# Patient Record
Sex: Male | Born: 1955 | Race: Black or African American | Hispanic: No | Marital: Single | Smoking: Never smoker
Health system: Southern US, Community
[De-identification: ages and names within clinical notes are randomized; demographics above are authoritative.]

## PROBLEM LIST (undated history)

## (undated) DIAGNOSIS — E785 Hyperlipidemia, unspecified: Secondary | ICD-10-CM

## (undated) DIAGNOSIS — I10 Essential (primary) hypertension: Secondary | ICD-10-CM

## (undated) DIAGNOSIS — E079 Disorder of thyroid, unspecified: Secondary | ICD-10-CM

## (undated) HISTORY — DX: Disorder of thyroid, unspecified: E07.9

## (undated) HISTORY — DX: Essential (primary) hypertension: I10

## (undated) HISTORY — DX: Hyperlipidemia, unspecified: E78.5

---

## 2014-03-21 DEATH — deceased

## 2020-03-20 ENCOUNTER — Encounter: Payer: Self-pay | Admitting: Emergency Medicine

## 2020-03-20 ENCOUNTER — Emergency Department
Admission: EM | Admit: 2020-03-20 | Discharge: 2020-03-20 | Disposition: A | Discharge status: Home / Self Care | Payer: 59 | Attending: Emergency Medicine | Admitting: Emergency Medicine

## 2020-03-20 ENCOUNTER — Emergency Department: Payer: 59

## 2020-03-20 ENCOUNTER — Other Ambulatory Visit: Payer: Self-pay

## 2020-03-20 DIAGNOSIS — Y999 Unspecified external cause status: Secondary | ICD-10-CM | POA: Diagnosis not present

## 2020-03-20 DIAGNOSIS — Y9389 Activity, other specified: Secondary | ICD-10-CM | POA: Diagnosis not present

## 2020-03-20 DIAGNOSIS — Z23 Encounter for immunization: Secondary | ICD-10-CM | POA: Diagnosis not present

## 2020-03-20 DIAGNOSIS — W293XXA Contact with powered garden and outdoor hand tools and machinery, initial encounter: Secondary | ICD-10-CM | POA: Diagnosis not present

## 2020-03-20 DIAGNOSIS — S81012A Laceration without foreign body, left knee, initial encounter: Secondary | ICD-10-CM | POA: Insufficient documentation

## 2020-03-20 DIAGNOSIS — Y9289 Other specified places as the place of occurrence of the external cause: Secondary | ICD-10-CM | POA: Insufficient documentation

## 2020-03-20 MED ORDER — LIDOCAINE HCL (PF) 1 % IJ SOLN
5.0000 mL | Freq: Once | INTRAMUSCULAR | Status: AC
  Administered 2020-03-20: 10 mL
  Filled 2020-03-20: qty 5

## 2020-03-20 MED ORDER — SULFAMETHOXAZOLE-TRIMETHOPRIM 800-160 MG PO TABS: 1.0000 | ORAL_TABLET | Freq: Two times a day (BID) | ORAL | 0 refills | Status: DC

## 2020-03-20 MED ORDER — TRAMADOL HCL 50 MG PO TABS: 50.0000 mg | ORAL_TABLET | Freq: Four times a day (QID) | ORAL | 0 refills | Status: DC | PRN

## 2020-03-20 MED ORDER — TETANUS-DIPHTH-ACELL PERTUSSIS 5-2.5-18.5 LF-MCG/0.5 IM SUSY
0.5000 mL | PREFILLED_SYRINGE | Freq: Once | INTRAMUSCULAR | Status: AC
  Administered 2020-03-20: 0.5 mL via INTRAMUSCULAR
  Filled 2020-03-20: qty 0.5

## 2020-03-20 MED ORDER — BACITRACIN-NEOMYCIN-POLYMYXIN 400-5-5000 EX OINT
TOPICAL_OINTMENT | Freq: Once | CUTANEOUS | Status: AC
  Administered 2020-03-20: 1 via TOPICAL
  Filled 2020-03-20: qty 1

## 2020-03-20 NOTE — ED Triage Notes (Addendum)
Pt in w/L knee laceration sustained while using chainsaw. Pt states he was cutting some overgrowth, and the teeth of the saw got caught, and grazed his knee. Has 1.5in lac present, and bleeding controlled. Full mobility to L leg

## 2020-03-20 NOTE — ED Provider Notes (Signed)
[facility] Emergency Department Provider Note   ____________________________________________   Event Date/Time   First MD Initiated Contact with Patient 03/20/20 1400     (approximate)  I have reviewed the triage vital signs and the nursing notes.   HISTORY  Chief Complaint Extremity Laceration    HPI Bryce [name] is a 65 y.o. male patient presents with left knee laceration.  Patient sustained injury while using a chainsaw.  Bleeding is controlled with direct pressure.  Patient denies loss sensation or use of the affected extremity.  Patient states tetanus status not up-to-date.  Rates pain as a 4/10.  Described pain as "sore".  No other palliative measure for complaint.         History reviewed. No pertinent past medical history.  There are no problems to display for this patient.   History reviewed. No pertinent surgical history.  Prior to Admission medications   Medication Sig Start Date End Date Taking? Authorizing Provider  levothyroxine (SYNTHROID) 50 MCG tablet Take 50 mcg by mouth daily before breakfast.   Yes [provider]  lisinopril (ZESTRIL) 40 MG tablet Take 40 mg by mouth daily.   Yes [provider]  simvastatin (ZOCOR) 80 MG tablet Take 80 mg by mouth daily.   Yes [provider]  sulfamethoxazole-trimethoprim (BACTRIM DS) 800-160 MG tablet Take 1 tablet by mouth 2 (two) times daily. 03/20/20  Yes Sable Feil, PA-C  traMADol (ULTRAM) 50 MG tablet Take 1 tablet (50 mg total) by mouth every 6 (six) hours as needed for moderate pain. 03/20/20  Yes Sable Feil, PA-C    Allergies Patient has no allergy information on record.  No family history on file.  Social History Social History   Tobacco Use  . Smoking status: Never Smoker  . Smokeless tobacco: Never Used  Substance Use Topics  . Alcohol use: Never  . Drug use: Never    Review of Systems Constitutional: No fever/chills Eyes: No  visual changes. ENT: No sore throat. Cardiovascular: Denies chest pain. Respiratory: Denies shortness of breath. Gastrointestinal: No abdominal pain.  No nausea, no vomiting.  No diarrhea.  No constipation. Genitourinary: Negative for dysuria. Musculoskeletal: Negative for back pain. Skin: Negative for rash.  Left anterior knee laceration Neurological: Negative for headaches, focal weakness or numbness.   ____________________________________________   PHYSICAL EXAM:  VITAL SIGNS: ED Triage Vitals  Enc Vitals Group     BP 03/20/20 1259 (!) 157/95     Pulse Rate 03/20/20 1259 83     Resp 03/20/20 1259 18     Temp 03/20/20 1259 98 F (36.7 C)     Temp Source 03/20/20 1259 Oral     SpO2 03/20/20 1259 98 %     Weight 03/20/20 1300 175 lb (79.4 kg)     Height --      Head Circumference --      Peak Flow --      Pain Score --      Pain Loc --      Pain Edu? --      Excl. in [place]? --    Constitutional: Alert and oriented. Well appearing and in no acute distress. Cardiovascular: Normal rate, regular rhythm. Grossly normal heart sounds.  Good peripheral circulation. Respiratory: Normal respiratory effort.  No retractions. Lungs CTAB. Gastrointestinal: Soft and nontender. No distention. No abdominal bruits. No CVA tenderness. Musculoskeletal: No lower extremity tenderness nor edema.  No joint effusions. Neurologic:  Normal speech and language.  No gross focal neurologic deficits are appreciated. No gait instability. Skin: 3 cm laceration inferior/ anterior left knee.   Psychiatric: Mood and affect are normal. Speech and behavior are normal.  ____________________________________________   LABS (all labs ordered are listed, but only abnormal results are displayed)  Labs Reviewed - No data to display ____________________________________________  EKG   ____________________________________________  RADIOLOGY I, Sable Feil, personally viewed and evaluated these images (plain  radiographs) as part of my medical decision making, as well as reviewing the written report by the radiologist.  ED MD interpretation:    Official radiology report(s): DG Knee 2 Views Left  Result Date: 03/20/2020 CLINICAL DATA:  The patient suffered a laceration to the anterior aspect of the left knee with a chain saw today. Initial encounter. EXAM: LEFT KNEE - 1-2 VIEW COMPARISON:  None. FINDINGS: There is no acute bony or joint abnormality. Bandaging is seen anterior to the patella. No radiopaque foreign body is identified. 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 Small exostosis along the medial metaphysis of the tibia measures 1.1 cm AP x 2.3 cm craniocaudal x 0.9 cm transverse. IMPRESSION: Laceration without underlying fracture or foreign body. Mild patellofemoral degenerative change. Small, benign exostosis medial metaphysis of the tibia. Electronically Signed   By: Inge Rise M.D.   On: 03/20/2020 14:05    ____________________________________________   PROCEDURES  Procedure(s) performed (including Critical Care):  Marland KitchenMarland KitchenLaceration Repair  Date/Time: 03/20/2020 2:40 PM Performed by: Sable Feil, PA-C Authorized by: Sable Feil, PA-C   Consent:    Consent obtained:  Verbal   Consent given by:  Patient   Risks discussed:  Infection, pain, poor cosmetic result, need for additional repair, nerve damage and poor wound healing Universal protocol:    Procedure explained and questions answered to patient or proxy's satisfaction: yes     Relevant documents present and verified: yes     Immediately prior to procedure, a time out was called: yes     Patient identity confirmed:  Verbally with patient Anesthesia:    Anesthesia method:  Local infiltration   Local anesthetic:  Lidocaine 1% w/o epi Laceration details:    Location:  Leg   Leg location:  L knee   Length (cm):  3   Depth (mm):   3 Pre-procedure details:    Preparation:  Patient was prepped and draped in usual sterile fashion Exploration:    Hemostasis achieved with:  Direct pressure   Contaminated: yes   Treatment:    Area cleansed with:  Povidone-iodine and saline   Amount of cleaning:  Extensive   Irrigation solution:  Sterile saline   Irrigation method:  Syringe   Visualized foreign bodies/material removed: yes     Debridement:  Minimal   Undermining:  None   Scar revision: no   Skin repair:    Repair method:  Sutures   Suture size:  3-0   Suture material:  Nylon   Suture technique:  Simple interrupted   Number of sutures:  10 Approximation:    Approximation:  Close Repair type:    Repair type:  Simple Post-procedure details:    Dressing:  Antibiotic ointment and sterile dressing   Procedure completion:  Tolerated well, no immediate complications     ____________________________________________   INITIAL IMPRESSION / ASSESSMENT AND PLAN / ED COURSE  As part of my medical decision making, I reviewed the following data within the  electronic MEDICAL RECORD NUMBER        Patient presents a laceration to the anterior left knee secondary to a chainsaw.  See procedure note for wound closure.  Patient was given a Tdap prior to departure.  Patient given discharge care instruction and advised take medication as directed.  Advised to have sutures removed in 10 days.      ____________________________________________   FINAL CLINICAL IMPRESSION(S) / ED DIAGNOSES  Final diagnoses:  Knee laceration, left, initial encounter     ED Discharge Orders         Ordered    sulfamethoxazole-trimethoprim (BACTRIM DS) 800-160 MG tablet  2 times daily        03/20/20 1448    traMADol (ULTRAM) 50 MG tablet  Every 6 hours PRN        03/20/20 1448          *Please note:  Bryce [name] was evaluated in Emergency Department on 03/20/2020 for the symptoms described in the history of present illness. He was  evaluated in the context of the global COVID-19 pandemic, which necessitated consideration that the patient might be at risk for infection with the SARS-CoV-2 virus that causes COVID-19. Institutional protocols and algorithms that pertain to the evaluation of patients at risk for COVID-19 are in a state of rapid change based on information released by regulatory bodies including the CDC and federal and state organizations. These policies and algorithms were followed during the patient's care in the ED.  Some ED evaluations and interventions may be delayed as a result of limited staffing during and the pandemic.*   Note:  This document was prepared using Dragon voice recognition software and may include unintentional dictation errors.    Sable Feil, PA-C 03/20/20 1500    Naaman Plummer, MD 03/21/20 [phone]

## 2020-03-20 NOTE — Discharge Instructions (Addendum)
Follow discharge care instruction and take medications as directed.  Have sutures removed in 10 days.

## 2020-03-20 NOTE — ED Notes (Signed)
Pt states he accidentally cut his left knee with a chain saw while cutting through a vine the chainsaw kicked back and cut him. Bleeding is controlled,  Clean saline gauze applied to wound

## 2020-03-30 ENCOUNTER — Emergency Department
Admission: EM | Admit: 2020-03-30 | Discharge: 2020-03-30 | Disposition: A | Discharge status: Home / Self Care | Payer: 59 | Attending: Emergency Medicine | Admitting: Emergency Medicine

## 2020-03-30 ENCOUNTER — Encounter: Payer: Self-pay | Admitting: Emergency Medicine

## 2020-03-30 ENCOUNTER — Other Ambulatory Visit: Payer: Self-pay

## 2020-03-30 DIAGNOSIS — Z4802 Encounter for removal of sutures: Secondary | ICD-10-CM

## 2020-03-30 DIAGNOSIS — X58XXXD Exposure to other specified factors, subsequent encounter: Secondary | ICD-10-CM | POA: Insufficient documentation

## 2020-03-30 DIAGNOSIS — S81012D Laceration without foreign body, left knee, subsequent encounter: Secondary | ICD-10-CM | POA: Insufficient documentation

## 2020-03-30 NOTE — ED Notes (Signed)
See triage note  Presents to have sutures removed  Denies any other sx's  Suture intact   No redness noted

## 2020-03-30 NOTE — ED Triage Notes (Signed)
Patient here to have sutures removed from left leg.

## 2020-03-30 NOTE — Discharge Instructions (Addendum)
Follow discharge care instruction return back to ED if condition worsens.

## 2020-03-30 NOTE — ED Provider Notes (Signed)
Bryce [name]   ____________________________________________   Event Date/Time   First MD Initiated Contact with Patient 03/30/20 1011     (approximate)  I have reviewed the triage vital signs and the nursing notes.   HISTORY  Chief Complaint Suture / Staple Removal    HPI Bryce [name] is a 65 y.o. male patient presents for suture removal.  Patient sustained a left knee laceration 10 days ago.  Patient voices no concerns or complaints.         History reviewed. No pertinent past medical history.  There are no problems to display for this patient.   History reviewed. No pertinent surgical history.  Prior to Admission medications   Medication Sig Start Date End Date Taking? Authorizing Provider  levothyroxine (SYNTHROID) 50 MCG tablet Take 50 mcg by mouth daily before breakfast.    [provider]  lisinopril (ZESTRIL) 40 MG tablet Take 40 mg by mouth daily.    [provider]  simvastatin (ZOCOR) 80 MG tablet Take 80 mg by mouth daily.    [provider]  sulfamethoxazole-trimethoprim (BACTRIM DS) 800-160 MG tablet Take 1 tablet by mouth 2 (two) times daily. 03/20/20   Sable Feil, PA-C  traMADol (ULTRAM) 50 MG tablet Take 1 tablet (50 mg total) by mouth every 6 (six) hours as needed for moderate pain. 03/20/20   Sable Feil, PA-C    Allergies Patient has no known allergies.  No family history on file.  Social History Social History   Tobacco Use  . Smoking status: Never Smoker  . Smokeless tobacco: Never Used  Substance Use Topics  . Alcohol use: Never  . Drug use: Never    Review of Systems Constitutional: No fever/chills Eyes: No visual changes. ENT: No sore throat. Cardiovascular: Denies chest pain. Respiratory: Denies shortness of breath. Gastrointestinal: No abdominal pain.  No nausea, no vomiting.  No diarrhea.  No constipation. Genitourinary: Negative for  dysuria. Musculoskeletal: Negative for back pain. Skin: Negative for rash.  Healing left knee laceration. Neurological: Negative for headaches, focal weakness or numbness.   ____________________________________________   PHYSICAL EXAM:  VITAL SIGNS: ED Triage Vitals  Enc Vitals Group     BP 03/30/20 0959 (!) 164/91     Pulse Rate 03/30/20 0959 67     Resp 03/30/20 0959 18     Temp 03/30/20 0959 (!) 97.5 F (36.4 C)     Temp Source 03/30/20 0959 Oral     SpO2 03/30/20 0959 100 %     Weight 03/30/20 1001 177 lb (80.3 kg)     Height 03/30/20 1001 5\' 9"  (1.753 m)     Head Circumference --      Peak Flow --      Pain Score 03/30/20 0957 0     Pain Loc --      Pain Edu? --      Excl. in [place]? --    Constitutional: Alert and oriented. Well appearing and in no acute distress. Cardiovascular: Normal rate, regular rhythm. Grossly normal heart sounds.  Good peripheral circulation.  Abated blood pressure. Respiratory: Normal respiratory effort.  No retractions. Lungs CTAB. Musculoskeletal: No lower extremity tenderness nor edema.  No joint effusions. Neurologic:  Normal speech and language. No gross focal neurologic deficits are appreciated. No gait instability. Skin:  Skin is warm, dry and intact. No rash noted. Psychiatric: Mood and affect are normal. Speech and behavior are normal.  ____________________________________________   LABS (  all labs ordered are listed, but only abnormal results are displayed)  Labs Reviewed - No data to display ____________________________________________  EKG   ____________________________________________  RADIOLOGY I, Sable Feil, personally viewed and evaluated these images (plain radiographs) as part of my medical decision making, as well as reviewing the written report by the radiologist.  ED MD interpretation:    Official radiology report(s): No results  found.  ____________________________________________   PROCEDURES  Procedure(s) performed (including Critical Care):  .Suture Removal  Date/Time: 03/30/2020 10:33 AM Performed by: Sable Feil, PA-C Authorized by: Sable Feil, PA-C   Consent:    Consent obtained:  Verbal   Consent given by:  Patient   Risks, benefits, and alternatives were discussed: yes     Risks discussed:  Bleeding, pain and wound separation Universal protocol:    Procedure explained and questions answered to patient or proxy's satisfaction: yes     Immediately prior to procedure, a time out was called: yes     Patient identity confirmed:  Verbally with patient Procedure details:    Wound appearance:  No signs of infection, good wound healing and clean   Number of sutures removed:  10 Post-procedure details:    Post-removal:  Steri-Strips applied and Band-Aid applied   Procedure completion:  Tolerated well, no immediate complications     ____________________________________________   INITIAL IMPRESSION / ASSESSMENT AND PLAN / ED COURSE  As part of my medical decision making, I reviewed the following data within the [place]         Patient presents with suture removal.  See procedure [name].  Follow-up as necessary.      ____________________________________________   FINAL CLINICAL IMPRESSION(S) / ED DIAGNOSES  Final diagnoses:  Encounter for removal of sutures     ED Discharge Orders    None      *Please [name]:  Bryce [name] was evaluated in Emergency Department on 03/30/2020 for the symptoms described in the history of present illness. He was evaluated in the context of the global COVID-19 pandemic, which necessitated consideration that the patient might be at risk for infection with the SARS-CoV-2 virus that causes COVID-19. Institutional protocols and algorithms that pertain to the evaluation of patients at risk for COVID-19 are in a state of rapid change  based on information released by regulatory bodies including the CDC and federal and state organizations. These policies and algorithms were followed during the patient's care in the ED.  Some ED evaluations and interventions may be delayed as a result of limited staffing during and the pandemic.*   [name]:  This document was prepared using Dragon voice recognition software and may include unintentional dictation errors.    Sable Feil, PA-C 03/30/20 1034    Lavonia Drafts, MD 03/30/20 1038

## 2021-02-07 ENCOUNTER — Encounter: Payer: Self-pay | Admitting: Internal Medicine

## 2021-02-07 ENCOUNTER — Other Ambulatory Visit: Payer: Self-pay

## 2021-02-07 ENCOUNTER — Ambulatory Visit: Payer: 59 | Admitting: Internal Medicine

## 2021-02-07 VITALS — BP 136/91 | HR 72 | Ht 69.0 in | Wt 177.2 lb

## 2021-02-07 DIAGNOSIS — E7800 Pure hypercholesterolemia, unspecified: Secondary | ICD-10-CM | POA: Diagnosis not present

## 2021-02-07 DIAGNOSIS — E039 Hypothyroidism, unspecified: Secondary | ICD-10-CM | POA: Diagnosis not present

## 2021-02-07 DIAGNOSIS — Z6826 Body mass index (BMI) 26.0-26.9, adult: Secondary | ICD-10-CM | POA: Insufficient documentation

## 2021-02-07 DIAGNOSIS — E663 Overweight: Secondary | ICD-10-CM | POA: Diagnosis not present

## 2021-02-07 DIAGNOSIS — I10 Essential (primary) hypertension: Secondary | ICD-10-CM | POA: Diagnosis not present

## 2021-02-07 DIAGNOSIS — E785 Hyperlipidemia, unspecified: Secondary | ICD-10-CM | POA: Insufficient documentation

## 2021-02-07 DIAGNOSIS — Z6825 Body mass index (BMI) 25.0-25.9, adult: Secondary | ICD-10-CM | POA: Insufficient documentation

## 2021-02-07 NOTE — Progress Notes (Signed)
HPI  Pt presents to the clinic today to establish care and for management of the conditions listed below.   Hypothyroidism: He denies any issues on his current dose of Levothyroxine. He does not follow with endocrinology.  HTN: His BP today is 139/61. He is taking Lisinopril as prescribed. There is no ECG on file.  HLD: There is no lipid panel on file. He denies myalgias on Simvastatin. He tries to consume a low fat diet.  No past medical history on file.  Current Outpatient Medications  Medication Sig Dispense Refill   levothyroxine (SYNTHROID) 50 MCG tablet Take 50 mcg by mouth daily before breakfast.     lisinopril (ZESTRIL) 40 MG tablet Take 40 mg by mouth daily.     simvastatin (ZOCOR) 80 MG tablet Take 80 mg by mouth daily.     sulfamethoxazole-trimethoprim (BACTRIM DS) 800-160 MG tablet Take 1 tablet by mouth 2 (two) times daily. 20 tablet 0   traMADol (ULTRAM) 50 MG tablet Take 1 tablet (50 mg total) by mouth every 6 (six) hours as needed for moderate pain. 12 tablet 0   No current facility-administered medications for this visit.    No Known Allergies  No family history on file.  Social History   Socioeconomic History   Marital status: Single    Spouse name: Not on file   Number of children: Not on file   Years of education: Not on file   Highest education level: Not on file  Occupational History   Not on file  Tobacco Use   Smoking status: Never   Smokeless tobacco: Never  Substance and Sexual Activity   Alcohol use: Never   Drug use: Never   Sexual activity: Not on file  Other Topics Concern   Not on file  Social History Narrative   Not on file   Social Determinants of Health   Financial Resource Strain: Not on file  Food Insecurity: Not on file  Transportation Needs: Not on file  Physical Activity: Not on file  Stress: Not on file  Social Connections: Not on file  Intimate Partner Violence: Not on file    ROS:  Constitutional: Denies fever,  malaise, fatigue, headache or abrupt weight changes.  HEENT: Denies eye pain, eye redness, ear pain, ringing in the ears, wax buildup, runny nose, nasal congestion, bloody nose, or sore throat. Respiratory: Denies difficulty breathing, shortness of breath, cough or sputum production.   Cardiovascular: Denies chest pain, chest tightness, palpitations or swelling in the hands or feet.  Gastrointestinal: Denies abdominal pain, bloating, constipation, diarrhea or blood in the stool.  GU: Denies frequency, urgency, pain with urination, blood in urine, odor or discharge. Musculoskeletal: Denies decrease in range of motion, difficulty with gait, muscle pain or joint pain and swelling.  Skin: Denies redness, rashes, lesions or ulcercations.  Neurological: Denies dizziness, difficulty with memory, difficulty with speech or problems with balance and coordination.  Psych: Denies anxiety, depression, SI/HI.  No other specific complaints in a complete review of systems (except as listed in HPI above).  PE: BP (!) 136/91    Pulse 72    Ht 5\' 9"  (1.753 m)    Wt 177 lb 3.2 oz (80.4 kg)    SpO2 100%    BMI 26.17 kg/m   Wt Readings from Last 3 Encounters:  03/30/20 177 lb (80.3 kg)  03/20/20 175 lb (79.4 kg)    General: Appears his stated age, overweight, in NAD. Skin: Dry and intact HEENT: Head: normal  shape and size; Eyes: EOMs intact;  Cardiovascular: Normal rate and rhythm. S1,S2 noted.  No murmur, rubs or gallops noted. No JVD or BLE edema. No carotid bruits noted. Pulmonary/Chest: Normal effort and positive vesicular breath sounds. No respiratory distress. No wheezes, rales or ronchi noted.  Musculoskeletal: No difficulty with gait.  Neurological: Alert and oriented.  Psychiatric: Mood and affect normal. Behavior is normal. Judgment and thought content normal.    Assessment and Plan:   Webb Silversmith, NP This visit occurred during the SARS-CoV-2 public health emergency.  Safety protocols were  in place, including screening questions prior to the visit, additional usage of staff PPE, and extensive cleaning of exam room while observing appropriate contact time as indicated for disinfecting solutions.

## 2021-02-07 NOTE — Assessment & Plan Note (Signed)
Reasonable control on Lisinopril, managed by [facility] diet and exercise for weight loss

## 2021-02-07 NOTE — Assessment & Plan Note (Signed)
Encourage diet and exercise for weight loss 

## 2021-02-07 NOTE — Assessment & Plan Note (Signed)
Continue Levothyroxine, managed by the [facility]

## 2021-02-07 NOTE — Assessment & Plan Note (Signed)
Continue Simvastatin, managed by the [facility]

## 2021-02-07 NOTE — Patient Instructions (Signed)
Heart-Healthy Eating Plan Many factors influence your heart (coronary) health, including eating and exercise habits. Coronary risk increases with abnormal blood fat (lipid) levels. Heart-healthy meal planning includes limiting unhealthy fats, increasing healthy fats, and making other diet and lifestyle changes. What is my plan? Your health care provider may recommend that you: Limit your fat intake to _________% or less of your total calories each day. Limit your saturated fat intake to _________% or less of your total calories each day. Limit the amount of cholesterol in your diet to less than _________ mg per day. What are tips for following this plan? Cooking Cook foods using methods other than frying. Baking, boiling, grilling, and broiling are all good options. Other ways to reduce fat include: Removing the skin from poultry. Removing all visible fats from meats. Steaming vegetables in water or broth. Meal planning  At meals, imagine dividing your plate into fourths: Fill one-half of your plate with vegetables and green salads. Fill one-fourth of your plate with whole grains. Fill one-fourth of your plate with lean protein foods. Eat 4-5 servings of vegetables per day. One serving equals 1 cup raw or cooked vegetable, or 2 cups raw leafy greens. Eat 4-5 servings of fruit per day. One serving equals 1 medium whole fruit,  cup dried fruit,  cup fresh, frozen, or canned fruit, or  cup 100% fruit juice. Eat more foods that contain soluble fiber. Examples include apples, broccoli, carrots, beans, peas, and barley. Aim to get 25-30 g of fiber per day. Increase your consumption of legumes, nuts, and seeds to 4-5 servings per week. One serving of dried beans or legumes equals  cup cooked, 1 serving of nuts is  cup, and 1 serving of seeds equals 1 tablespoon. Fats Choose healthy fats more often. Choose monounsaturated and polyunsaturated fats, such as olive and canola oils, flaxseeds,  walnuts, almonds, and seeds. Eat more omega-3 fats. Choose salmon, mackerel, sardines, tuna, flaxseed oil, and ground flaxseeds. Aim to eat fish at least 2 times each week. Check food labels carefully to identify foods with trans fats or high amounts of saturated fat. Limit saturated fats. These are found in animal products, such as meats, butter, and cream. Plant sources of saturated fats include palm oil, palm kernel oil, and coconut oil. Avoid foods with partially hydrogenated oils in them. These contain trans fats. Examples are stick margarine, some tub margarines, cookies, crackers, and other baked goods. Avoid fried foods. General information Eat more home-cooked food and less restaurant, buffet, and fast food. Limit or avoid alcohol. Limit foods that are high in starch and sugar. Lose weight if you are overweight. Losing just 5-10% of your body weight can help your overall health and prevent diseases such as diabetes and heart disease. Monitor your salt (sodium) intake, especially if you have high blood pressure. Talk with your health care provider about your sodium intake. Try to incorporate more vegetarian meals weekly. What foods can I eat? Fruits All fresh, canned (in natural juice), or frozen fruits. Vegetables Fresh or frozen vegetables (raw, steamed, roasted, or grilled). Green salads. Grains Most grains. Choose whole wheat and whole grains most of the time. Rice and pasta, including brown rice and pastas made with whole wheat. Meats and other proteins Lean, well-trimmed beef, veal, pork, and lamb. Chicken and turkey without skin. All fish and shellfish. Wild duck, rabbit, pheasant, and venison. Egg whites or low-cholesterol egg substitutes. Dried beans, peas, lentils, and tofu. Seeds and most nuts. Dairy Low-fat or nonfat cheeses,   including ricotta and mozzarella. Skim or 1% milk (liquid, powdered, or evaporated). Buttermilk made with low-fat milk. Nonfat or low-fat  yogurt. Fats and oils Non-hydrogenated (trans-free) margarines. Vegetable oils, including soybean, sesame, sunflower, olive, peanut, safflower, corn, canola, and cottonseed. Salad dressings or mayonnaise made with a vegetable oil. Beverages Water (mineral or sparkling). Coffee and tea. Diet carbonated beverages. Sweets and desserts Sherbet, gelatin, and fruit ice. Small amounts of dark chocolate. Limit all sweets and desserts. Seasonings and condiments All seasonings and condiments. The items listed above may not be a complete list of foods and beverages you can eat. Contact a dietitian for more options. What foods are not recommended? Fruits Canned fruit in heavy syrup. Fruit in cream or butter sauce. Fried fruit. Limit coconut. Vegetables Vegetables cooked in cheese, cream, or butter sauce. Fried vegetables. Grains Breads made with saturated or trans fats, oils, or whole milk. Croissants. Sweet rolls. Donuts. High-fat crackers, such as cheese crackers. Meats and other proteins Fatty meats, such as hot dogs, ribs, sausage, bacon, rib-eye roast or steak. High-fat deli meats, such as salami and bologna. Caviar. Domestic duck and goose. Organ meats, such as liver. Dairy Cream, sour cream, cream cheese, and creamed cottage cheese. Whole-milk cheeses. Whole or 2% milk (liquid, evaporated, or condensed). Whole buttermilk. Cream sauce or high-fat cheese sauce. Whole-milk yogurt. Fats and oils Meat fat, or shortening. Cocoa butter, hydrogenated oils, palm oil, coconut oil, palm kernel oil. Solid fats and shortenings, including bacon fat, salt pork, lard, and butter. Nondairy cream substitutes. Salad dressings with cheese or sour cream. Beverages Regular sodas and any drinks with added sugar. Sweets and desserts Frosting. Pudding. Cookies. Cakes. Pies. Milk chocolate or white chocolate. Buttered syrups. Full-fat ice cream or ice cream drinks. The items listed above may not be a complete list of  foods and beverages to avoid. Contact a dietitian for more information. Summary Heart-healthy meal planning includes limiting unhealthy fats, increasing healthy fats, and making other diet and lifestyle changes. Lose weight if you are overweight. Losing just 5-10% of your body weight can help your overall health and prevent diseases such as diabetes and heart disease. Focus on eating a balance of foods, including fruits and vegetables, low-fat or nonfat dairy, lean protein, nuts and legumes, whole grains, and heart-healthy oils and fats. This information is not intended to replace advice given to you by your health care provider. Make sure you discuss any questions you have with your health care provider. Document Revised: 05/17/2020 Document Reviewed: 05/17/2020 Elsevier Patient Education  2022 Elsevier Inc.  

## 2021-04-25 DIAGNOSIS — H43813 Vitreous degeneration, bilateral: Secondary | ICD-10-CM | POA: Diagnosis not present

## 2021-12-17 ENCOUNTER — Other Ambulatory Visit: Payer: Self-pay

## 2021-12-17 ENCOUNTER — Telehealth: Payer: Self-pay

## 2021-12-17 ENCOUNTER — Ambulatory Visit (INDEPENDENT_AMBULATORY_CARE_PROVIDER_SITE_OTHER): Payer: Medicare Other | Admitting: Internal Medicine

## 2021-12-17 ENCOUNTER — Encounter: Payer: Self-pay | Admitting: Internal Medicine

## 2021-12-17 VITALS — BP 147/84 | HR 65 | Temp 97.1°F | Ht 70.0 in | Wt 168.0 lb

## 2021-12-17 DIAGNOSIS — R7309 Other abnormal glucose: Secondary | ICD-10-CM | POA: Diagnosis not present

## 2021-12-17 DIAGNOSIS — Z86010 Personal history of colonic polyps: Secondary | ICD-10-CM

## 2021-12-17 DIAGNOSIS — R768 Other specified abnormal immunological findings in serum: Secondary | ICD-10-CM | POA: Diagnosis not present

## 2021-12-17 DIAGNOSIS — Z0001 Encounter for general adult medical examination with abnormal findings: Secondary | ICD-10-CM | POA: Diagnosis not present

## 2021-12-17 DIAGNOSIS — E039 Hypothyroidism, unspecified: Secondary | ICD-10-CM | POA: Diagnosis not present

## 2021-12-17 DIAGNOSIS — Z1211 Encounter for screening for malignant neoplasm of colon: Secondary | ICD-10-CM

## 2021-12-17 DIAGNOSIS — E7800 Pure hypercholesterolemia, unspecified: Secondary | ICD-10-CM

## 2021-12-17 DIAGNOSIS — Z23 Encounter for immunization: Secondary | ICD-10-CM | POA: Diagnosis not present

## 2021-12-17 DIAGNOSIS — Z1159 Encounter for screening for other viral diseases: Secondary | ICD-10-CM

## 2021-12-17 DIAGNOSIS — I10 Essential (primary) hypertension: Secondary | ICD-10-CM

## 2021-12-17 MED ORDER — NA SULFATE-K SULFATE-MG SULF 17.5-3.13-1.6 GM/177ML PO SOLN: 1.0000 | Freq: Once | ORAL | 0 refills | Status: AC

## 2021-12-17 NOTE — Assessment & Plan Note (Signed)
Uncontrolled off lisinopril, he will restart this today Reinforced DASH diet

## 2021-12-17 NOTE — Progress Notes (Signed)
 Subjective:    Patient ID: Bryce [name], male    DOB: 11/17/1955, 66 y.o.   MRN: [id number]  HPI  Patient presents to clinic today for his annual exam.  Of note, his BP today is 152/82.  He reports he has not taken his Lisinopril in 2 weeks because he has not been feeling well  Flu: never Tetanus: 03/2020 COVID: x 4 Pneumovax: never Prevnar: never Shingrix: x 2 at [facility] Colon screening:  > 10 years ago Vision screening: annually Dentist: biannually  Diet: He does eat lean meat. He consumes fruits and veggies. He tries to avoid fried foods. He drinks mostly water, hot tea. Exercise: body weight exercise, treadmill.  Review of Systems     Past Medical History:  Diagnosis Date   Hyperlipidemia    Hypertension    Thyroid disease     Current Outpatient Medications  Medication Sig Dispense Refill   Cholecalciferol 25 MCG (1000 UT) tablet TAKE TWO TABLETS BY MOUTH ONCE EVERY DAY     levothyroxine (SYNTHROID) 50 MCG tablet Take 50 mcg by mouth daily before breakfast.     lisinopril (ZESTRIL) 40 MG tablet Take 40 mg by mouth daily.     simvastatin (ZOCOR) 80 MG tablet Take 80 mg by mouth daily.     No current facility-administered medications for this visit.    No Known Allergies  No family history on file.  Social History   Socioeconomic History   Marital status: Single    Spouse name: Not on file   Number of children: Not on file   Years of education: Not on file   Highest education level: Not on file  Occupational History   Not on file  Tobacco Use   Smoking status: Never   Smokeless tobacco: Never  Substance and Sexual Activity   Alcohol use: Never   Drug use: Never   Sexual activity: Not on file  Other Topics Concern   Not on file  Social History Narrative   Not on file   Social Determinants of Health   Financial Resource Strain: Not on file  Food Insecurity: Not on file  Transportation Needs: Not on file  Physical  Activity: Not on file  Stress: Not on file  Social Connections: Not on file  Intimate Partner Violence: Not on file     Constitutional: Denies fever, malaise, fatigue, headache or abrupt weight changes.  HEENT: Denies eye pain, eye redness, ear pain, ringing in the ears, wax buildup, runny nose, nasal congestion, bloody nose, or sore throat. Respiratory: Denies difficulty breathing, shortness of breath, cough or sputum production.   Cardiovascular: Denies chest pain, chest tightness, palpitations or swelling in the hands or feet.  Gastrointestinal: Denies abdominal pain, bloating, constipation, diarrhea or blood in the stool.  GU: Denies urgency, frequency, pain with urination, burning sensation, blood in urine, odor or discharge. Musculoskeletal: Denies decrease in range of motion, difficulty with gait, muscle pain or joint pain and swelling.  Skin: Denies redness, rashes, lesions or ulcercations.  Neurological: Denies dizziness, difficulty with memory, difficulty with speech or problems with balance and coordination.  Psych: Denies anxiety, depression, SI/HI.  No other specific complaints in a complete review of systems (except as listed in HPI above).  Objective:   Physical Exam   BP (!) 147/84 (BP Location: Right Arm, Patient Position: Sitting, Cuff Size: Normal)   Pulse 65   Temp (!) 97.1 F (36.2 C) (Temporal)   Ht 5' 10" (  1.778 m)   Wt 168 lb (76.2 kg)   SpO2 100%   BMI 24.11 kg/m   Wt Readings from Last 3 Encounters:  02/07/21 177 lb 3.2 oz (80.4 kg)  03/30/20 177 lb (80.3 kg)  03/20/20 175 lb (79.4 kg)    General: Appears his stated age, well developed, well nourished in NAD. Skin: Warm, dry and intact.  HEENT: Head: normal shape and size; Eyes: sclera white, no icterus, conjunctiva pink, PERRLA and EOMs intact;  Neck:  Neck supple, trachea midline. No masses, lumps or thyromegaly present.  Cardiovascular: Normal rate and rhythm. S1,S2 noted.  No murmur, rubs or  gallops noted. No JVD or BLE edema. No carotid bruits noted.  Pulses 2+ BLE. Pulmonary/Chest: Normal effort and positive vesicular breath sounds. No respiratory distress. No wheezes, rales or ronchi noted.  Abdomen: Soft and nontender. Normal bowel sounds.  Musculoskeletal: Strength 5/5 BUE/BLE. No difficulty with gait.  Neurological: Alert and oriented. Cranial nerves II-XII grossly intact. Coordination normal.  Psychiatric: Mood and affect normal. Behavior is normal. Judgment and thought content normal.          Assessment & Plan:   Preventative Health Maintenance:  He declines flu shots Tetanus UTD  Encouraged him to get his covid booster Prevnar 20 today, he will not need Pneumovax Discussed Shingrix vaccine, he will check coverage with his insurance company and schedule a visit at the pharmacy if he would like to have this done Referral to GI for screening colonoscopy Encouraged him to consume a balanced diet and exercise regimen Advised him to see an eye doctor and dentist annually We will check CBC, c-Met, TSH, free T4, lipid, A1c and hep C today  RTC in 2 weeks for follow-up HTN, 6 months, follow-up chronic conditions Regina Baity, NP  

## 2021-12-17 NOTE — Patient Instructions (Signed)
Health Maintenance After Age 65 After age 65, you are at a higher risk for certain long-term diseases and infections as well as injuries from falls. Falls are a major cause of broken bones and head injuries in people who are older than age 65. Getting regular preventive care can help to keep you healthy and well. Preventive care includes getting regular testing and making lifestyle changes as recommended by your health care provider. Talk with your health care provider about: Which screenings and tests you should have. A screening is a test that checks for a disease when you have no symptoms. A diet and exercise plan that is right for you. What should I know about screenings and tests to prevent falls? Screening and testing are the best ways to find a health problem early. Early diagnosis and treatment give you the best chance of managing medical conditions that are common after age 65. Certain conditions and lifestyle choices may make you more likely to have a fall. Your health care provider may recommend: Regular vision checks. Poor vision and conditions such as cataracts can make you more likely to have a fall. If you wear glasses, make sure to get your prescription updated if your vision changes. Medicine review. Work with your health care provider to regularly review all of the medicines you are taking, including over-the-counter medicines. Ask your health care provider about any side effects that may make you more likely to have a fall. Tell your health care provider if any medicines that you take make you feel dizzy or sleepy. Strength and balance checks. Your health care provider may recommend certain tests to check your strength and balance while standing, walking, or changing positions. Foot health exam. Foot pain and numbness, as well as not wearing proper footwear, can make you more likely to have a fall. Screenings, including: Osteoporosis screening. Osteoporosis is a condition that causes  the bones to get weaker and break more easily. Blood pressure screening. Blood pressure changes and medicines to control blood pressure can make you feel dizzy. Depression screening. You may be more likely to have a fall if you have a fear of falling, feel depressed, or feel unable to do activities that you used to do. Alcohol use screening. Using too much alcohol can affect your balance and may make you more likely to have a fall. Follow these instructions at home: Lifestyle Do not drink alcohol if: Your health care provider tells you not to drink. If you drink alcohol: Limit how much you have to: 0-1 drink a day for women. 0-2 drinks a day for men. Know how much alcohol is in your drink. In the [place] one drink equals one 12 oz bottle of beer (355 mL), one 5 oz glass of wine (148 mL), or one 1 oz glass of hard liquor (44 mL). Do not use any products that contain nicotine or tobacco. These products include cigarettes, chewing tobacco, and vaping devices, such as e-cigarettes. If you need help quitting, ask your health care provider. Activity  Follow a regular exercise program to stay fit. This will help you maintain your balance. Ask your health care provider what types of exercise are appropriate for you. If you need a cane or walker, use it as recommended by your health care provider. Wear supportive shoes that have nonskid soles. Safety  Remove any tripping hazards, such as rugs, cords, and clutter. Install safety equipment such as grab bars in bathrooms and safety rails on stairs. Keep rooms and walkways   well-lit. General instructions Talk with your health care provider about your risks for falling. Tell your health care provider if: You fall. Be sure to tell your health care provider about all falls, even ones that seem minor. You feel dizzy, tiredness (fatigue), or off-balance. Take over-the-counter and prescription medicines only as told by your health care provider. These include  supplements. Eat a healthy diet and maintain a healthy weight. A healthy diet includes low-fat dairy products, low-fat (lean) meats, and fiber from whole grains, beans, and lots of fruits and vegetables. Stay current with your vaccines. Schedule regular health, dental, and eye exams. Summary Having a healthy lifestyle and getting preventive care can help to protect your health and wellness after age 65. Screening and testing are the best way to find a health problem early and help you avoid having a fall. Early diagnosis and treatment give you the best chance for managing medical conditions that are more common for people who are older than age 65. Falls are a major cause of broken bones and head injuries in people who are older than age 65. Take precautions to prevent a fall at home. Work with your health care provider to learn what changes you can make to improve your health and wellness and to prevent falls. This information is not intended to replace advice given to you by your health care provider. Make sure you discuss any questions you have with your health care provider. Document Revised: 05/28/2020 Document Reviewed: 05/28/2020 Elsevier Patient Education  2023 Elsevier Inc.  

## 2021-12-17 NOTE — Telephone Encounter (Signed)
Gastroenterology Pre-Procedure Review  Request Date: 12/30/21 Requesting Physician: Dr. Vicente Males  PATIENT REVIEW QUESTIONS: The patient responded to the following health history questions as indicated:    1. Are you having any GI issues? no 2. Do you have a personal history of Polyps? yes (over 10 years ago at the [facility]) 3. Do you have a family history of Colon Cancer or Polyps? no 4. Diabetes Mellitus? no 5. Joint replacements in the past 12 months?no 6. Major health problems in the past 3 months?no 7. Any artificial heart valves, MVP, or defibrillator?no    MEDICATIONS & ALLERGIES:    Patient reports the following regarding taking any anticoagulation/antiplatelet therapy:   Plavix, Coumadin, Eliquis, Xarelto, Lovenox, Pradaxa, Brilinta, or Effient? no Aspirin? no  Patient confirms/reports the following medications:  Current Outpatient Medications  Medication Sig Dispense Refill   Cholecalciferol 25 MCG (1000 UT) tablet TAKE TWO TABLETS BY MOUTH ONCE EVERY DAY     levothyroxine (SYNTHROID) 50 MCG tablet Take 50 mcg by mouth daily before breakfast.     lisinopril (ZESTRIL) 40 MG tablet Take 40 mg by mouth daily.     simvastatin (ZOCOR) 80 MG tablet Take 80 mg by mouth daily.     No current facility-administered medications for this visit.    Patient confirms/reports the following allergies:  No Known Allergies  No orders of the defined types were placed in this encounter.   AUTHORIZATION INFORMATION Primary Insurance: 1D#: Group #:  Secondary Insurance: 1D#: Group #:  SCHEDULE INFORMATION: Date: 12/30/21 Time: Location: [facility]

## 2021-12-19 ENCOUNTER — Other Ambulatory Visit: Payer: Self-pay

## 2021-12-19 DIAGNOSIS — R768 Other specified abnormal immunological findings in serum: Secondary | ICD-10-CM

## 2021-12-19 NOTE — Addendum Note (Signed)
Addended by: Jearld Fenton on: 12/19/2021 11:39 AM   Modules accepted: Orders

## 2021-12-20 ENCOUNTER — Other Ambulatory Visit: Payer: 59

## 2021-12-20 DIAGNOSIS — R768 Other specified abnormal immunological findings in serum: Secondary | ICD-10-CM | POA: Diagnosis not present

## 2021-12-20 MED ORDER — ATORVASTATIN CALCIUM 20 MG PO TABS: 20.0000 mg | ORAL_TABLET | Freq: Every day | ORAL | 1 refills | Status: DC

## 2021-12-20 NOTE — Addendum Note (Signed)
Addended by: Jearld Fenton on: 12/20/2021 11:34 AM   Modules accepted: Orders

## 2021-12-20 NOTE — Addendum Note (Signed)
Addended by: Jearld Fenton on: 12/20/2021 11:33 AM   Modules accepted: Orders

## 2021-12-21 LAB — COMPLETE METABOLIC PANEL WITH GFR
AG Ratio: 1.6 (calc) (ref 1.0–2.5)
ALT: 15 U/L (ref 9–46)
AST: 19 U/L (ref 10–35)
Albumin: 4.4 g/dL (ref 3.6–5.1)
Alkaline phosphatase (APISO): 45 U/L (ref 35–144)
BUN: 17 mg/dL (ref 7–25)
CO2: 26 mmol/L (ref 20–32)
Calcium: 10.4 mg/dL — ABNORMAL HIGH (ref 8.6–10.3)
Chloride: 108 mmol/L (ref 98–110)
Creat: 1.27 mg/dL (ref 0.70–1.35)
Globulin: 2.7 g/dL (calc) (ref 1.9–3.7)
Glucose, Bld: 98 mg/dL (ref 65–99)
Potassium: 3.9 mmol/L (ref 3.5–5.3)
Sodium: 142 mmol/L (ref 135–146)
Total Bilirubin: 1 mg/dL (ref 0.2–1.2)
Total Protein: 7.1 g/dL (ref 6.1–8.1)
eGFR: 62 mL/min/{1.73_m2} (ref >=60)

## 2021-12-21 LAB — CBC
HCT: 44.2 % (ref 38.5–50.0)
Hemoglobin: 14.9 g/dL (ref 13.2–17.1)
MCH: 28.8 pg (ref 27.0–33.0)
MCHC: 33.7 g/dL (ref 32.0–36.0)
MCV: 85.3 fL (ref 80.0–100.0)
MPV: 10.5 fL (ref 7.5–12.5)
Platelets: 156 10*3/uL (ref 140–400)
RBC: 5.18 10*6/uL (ref 4.20–5.80)
RDW: 13.6 % (ref 11.0–15.0)
WBC: 4.6 10*3/uL (ref 3.8–10.8)

## 2021-12-21 LAB — HEMOGLOBIN A1C
Hgb A1c MFr Bld: 5.6 % of total Hgb (ref <5.7)
Mean Plasma Glucose: 114 mg/dL
eAG (mmol/L): 6.3 mmol/L

## 2021-12-21 LAB — HCV RNA,QUANTITATIVE REAL TIME PCR
HCV Quantitative Log: <1.18 Log IU/mL
HCV RNA, PCR, QN: <15 IU/mL

## 2021-12-21 LAB — LIPID PANEL
Cholesterol: 260 mg/dL — ABNORMAL HIGH (ref <200)
HDL: 66 mg/dL (ref >=40)
LDL Cholesterol (Calc): 179 mg/dL (calc) — ABNORMAL HIGH
Non-HDL Cholesterol (Calc): 194 mg/dL (calc) — ABNORMAL HIGH (ref <130)
Total CHOL/HDL Ratio: 3.9 (calc) (ref <5.0)
Triglycerides: 48 mg/dL (ref <150)

## 2021-12-21 LAB — TSH+FREE T4: TSH W/REFLEX TO FT4: 1.08 mIU/L (ref 0.40–4.50)

## 2021-12-21 LAB — HEPATITIS C ANTIBODY: Hepatitis C Ab: REACTIVE — AB

## 2021-12-25 LAB — HEPATITIS C RNA QUANTITATIVE
HCV Quantitative Log: <1.18 log IU/mL
HCV RNA, PCR, QN: <15 IU/mL

## 2021-12-25 LAB — HEPATITIS C GENOTYPE: HCV Genotype: NOT DETECTED

## 2021-12-27 ENCOUNTER — Encounter: Payer: Self-pay | Admitting: Gastroenterology

## 2021-12-30 ENCOUNTER — Ambulatory Visit
Admission: RE | Admit: 2021-12-30 | Discharge: 2021-12-30 | Disposition: A | Discharge status: Home / Self Care | Payer: Medicare Other | Source: Ambulatory Visit | Attending: Gastroenterology | Admitting: Gastroenterology

## 2021-12-30 ENCOUNTER — Ambulatory Visit: Payer: Medicare Other | Admitting: Anesthesiology

## 2021-12-30 ENCOUNTER — Encounter: Payer: Self-pay | Admitting: Gastroenterology

## 2021-12-30 ENCOUNTER — Encounter
Admission: RE | Disposition: A | Discharge status: Home / Self Care | Payer: Self-pay | Source: Ambulatory Visit | Attending: Gastroenterology

## 2021-12-30 DIAGNOSIS — E039 Hypothyroidism, unspecified: Secondary | ICD-10-CM | POA: Diagnosis not present

## 2021-12-30 DIAGNOSIS — Z86010 Personal history of colonic polyps: Secondary | ICD-10-CM

## 2021-12-30 DIAGNOSIS — D126 Benign neoplasm of colon, unspecified: Secondary | ICD-10-CM | POA: Diagnosis not present

## 2021-12-30 DIAGNOSIS — K640 First degree hemorrhoids: Secondary | ICD-10-CM | POA: Diagnosis not present

## 2021-12-30 DIAGNOSIS — K635 Polyp of colon: Secondary | ICD-10-CM | POA: Diagnosis not present

## 2021-12-30 DIAGNOSIS — I10 Essential (primary) hypertension: Secondary | ICD-10-CM | POA: Insufficient documentation

## 2021-12-30 DIAGNOSIS — E079 Disorder of thyroid, unspecified: Secondary | ICD-10-CM | POA: Insufficient documentation

## 2021-12-30 DIAGNOSIS — E785 Hyperlipidemia, unspecified: Secondary | ICD-10-CM | POA: Insufficient documentation

## 2021-12-30 DIAGNOSIS — Z1211 Encounter for screening for malignant neoplasm of colon: Secondary | ICD-10-CM | POA: Diagnosis not present

## 2021-12-30 HISTORY — PX: COLONOSCOPY WITH PROPOFOL: SHX5780

## 2021-12-30 SURGERY — COLONOSCOPY WITH PROPOFOL
Anesthesia: General

## 2021-12-30 MED ORDER — PHENYLEPHRINE 80 MCG/ML (10ML) SYRINGE FOR IV PUSH (FOR BLOOD PRESSURE SUPPORT)
PREFILLED_SYRINGE | INTRAVENOUS | Status: AC
  Filled 2021-12-30: qty 10

## 2021-12-30 MED ORDER — SODIUM CHLORIDE 0.9 % IV SOLN: INTRAVENOUS | Status: DC

## 2021-12-30 MED ORDER — PROPOFOL 10 MG/ML IV BOLUS
INTRAVENOUS | Status: DC | PRN
  Administered 2021-12-30: 70 mg via INTRAVENOUS

## 2021-12-30 MED ORDER — PROPOFOL 10 MG/ML IV BOLUS: INTRAVENOUS | Status: DC | PRN

## 2021-12-30 MED ORDER — PROPOFOL 1000 MG/100ML IV EMUL
INTRAVENOUS | Status: AC
  Filled 2021-12-30: qty 100

## 2021-12-30 MED ORDER — SIMETHICONE 40 MG/0.6ML PO SUSP
ORAL | Status: DC | PRN
  Administered 2021-12-30: 120 mL

## 2021-12-30 MED ORDER — PROPOFOL 500 MG/50ML IV EMUL
INTRAVENOUS | Status: DC | PRN
  Administered 2021-12-30: 140 ug/kg/min via INTRAVENOUS

## 2021-12-30 NOTE — Anesthesia Preprocedure Evaluation (Signed)
Anesthesia Evaluation  Patient identified by MRN, date of birth, ID band Patient awake    Reviewed: Allergy & Precautions, NPO status , Patient's Chart, lab work & pertinent test results  Airway Mallampati: III  TM Distance: >3 FB Neck ROM: full    Dental  (+) Chipped   Pulmonary neg pulmonary ROS, neg shortness of breath   Pulmonary exam normal        Cardiovascular Exercise Tolerance: Good hypertension, Normal cardiovascular exam     Neuro/Psych negative neurological ROS  negative psych ROS   GI/Hepatic negative GI ROS, Neg liver ROS,neg GERD  ,,  Endo/Other  Hypothyroidism    Renal/GU negative Renal ROS  negative genitourinary   Musculoskeletal   Abdominal   Peds  Hematology negative hematology ROS (+)   Anesthesia Other Findings Past Medical History: No date: Hyperlipidemia No date: Hypertension No date: Thyroid disease  History reviewed. No pertinent surgical history.  BMI    Body Mass Index: 22.64 kg/m      Reproductive/Obstetrics negative OB ROS                             Anesthesia Physical Anesthesia Plan  ASA: 2  Anesthesia Plan: General   Post-op Pain Management:    Induction: Intravenous  PONV Risk Score and Plan: Propofol infusion and TIVA  Airway Management Planned: Natural Airway and Nasal Cannula  Additional Equipment:   Intra-op Plan:   Post-operative Plan:   Informed Consent: I have reviewed the patients History and Physical, chart, labs and discussed the procedure including the risks, benefits and alternatives for the proposed anesthesia with the patient or authorized representative who has indicated his/her understanding and acceptance.     Dental Advisory Given  Plan Discussed with: Anesthesiologist, CRNA and Surgeon  Anesthesia Plan Comments: (Patient consented for risks of anesthesia including but not limited to:  - adverse reactions to  medications - risk of airway placement if required - damage to eyes, teeth, lips or other oral mucosa - nerve damage due to positioning  - sore throat or hoarseness - Damage to heart, brain, nerves, lungs, other parts of body or loss of life  Patient voiced understanding.)       Anesthesia Quick Evaluation

## 2021-12-30 NOTE — Transfer of Care (Signed)
Immediate Anesthesia Transfer of Care Note  Patient: Bryce [name]  Procedure(s) Performed: COLONOSCOPY WITH PROPOFOL  Patient Location: PACU  Anesthesia Type:General  Level of Consciousness: awake, alert , and oriented  Airway & Oxygen Therapy: Patient Spontanous Breathing  Post-op Assessment: Report given to RN and Post -op Vital signs reviewed and stable  Post vital signs: Reviewed and stable  Last Vitals:  Vitals Value Taken Time  BP 86/56 12/30/21 0836  Temp 36.2 C 12/30/21 0834  Pulse 74 12/30/21 0836  Resp 17 12/30/21 0836  SpO2 100 % 12/30/21 0836  Vitals shown include unvalidated device data.  Last Pain:  Vitals:   12/30/21 0834  TempSrc: Temporal  PainSc: Asleep         Complications: No notable events documented.

## 2021-12-30 NOTE — H&P (Signed)
     Jonathon Bellows, MD [address] [place], [place], [place], [address] [id number], [place], [place], [place], [address] Phone: [phone]  Fax: [phone]  Primary Care Physician:  Jearld Fenton, NP   Pre-Procedure History & Physical: HPI:  Bryce [name] is a 66 y.o. male is here for an colonoscopy.   Past Medical History:  Diagnosis Date   Hyperlipidemia    Hypertension    Thyroid disease     History reviewed. No pertinent surgical history.  Prior to Admission medications   Medication Sig Start Date End Date Taking? Authorizing Provider  atorvastatin (LIPITOR) 20 MG tablet Take 1 tablet (20 mg total) by mouth daily. 12/20/21  Yes Baity, Coralie Keens, NP  levothyroxine (SYNTHROID) 50 MCG tablet Take 50 mcg by mouth daily before breakfast.   Yes [provider]  lisinopril (ZESTRIL) 40 MG tablet Take 40 mg by mouth daily.   Yes [provider]  Cholecalciferol 25 MCG (1000 UT) tablet TAKE TWO TABLETS BY MOUTH ONCE EVERY DAY 10/19/20   [provider]    Allergies as of 12/17/2021   (No Known Allergies)    Family History  Problem Relation Age of Onset   Breast cancer Mother    Cancer Father        Unknown Cancer   Healthy Sister     Social History   Socioeconomic History   Marital status: Single    Spouse name: Not on file   Number of children: Not on file   Years of education: Not on file   Highest education level: Not on file  Occupational History   Not on file  Tobacco Use   Smoking status: Never   Smokeless tobacco: Never  Vaping Use   Vaping Use: Never used  Substance and Sexual Activity   Alcohol use: Never   Drug use: Never   Sexual activity: Not on file  Other Topics Concern   Not on file  Social History Narrative   Not on file   Social Determinants of Health   Financial Resource Strain: Not on file  Food Insecurity: Not on file  Transportation Needs: Not on file  Physical Activity: Not on file  Stress:  Not on file  Social Connections: Not on file  Intimate Partner Violence: Not on file    Review of Systems: See HPI, otherwise negative ROS  Physical Exam: BP (!) 127/90   Pulse 92   Temp (!) 97.2 F (36.2 C) (Temporal)   Resp 18   Ht '5\' 10"'$  (1.778 m)   Wt 71.6 kg   SpO2 100%   BMI 22.64 kg/m  General:   Alert,  pleasant and cooperative in NAD Head:  Normocephalic and atraumatic. Neck:  Supple; no masses or thyromegaly. Lungs:  Clear throughout to auscultation, normal respiratory effort.    Heart:  +S1, +S2, Regular rate and rhythm, No edema. Abdomen:  Soft, nontender and nondistended. Normal bowel sounds, without guarding, and without rebound.   Neurologic:  Alert and  oriented x4;  grossly normal neurologically.  Impression/Plan: [name] is here for an colonoscopy to be performed for Screening colonoscopy average risk   Risks, benefits, limitations, and alternatives regarding  colonoscopy have been reviewed with the patient.  Questions have been answered.  All parties agreeable.   Jonathon Bellows, MD  12/30/2021, 8:07 AM

## 2021-12-30 NOTE — Op Note (Signed)
[facility] Gastroenterology Patient Name: Bryce [name] Procedure Date: 12/30/2021 8:07 AM MRN: [id number] Account #: [id number] Date of Birth: 11/21/1955 Admit Type: Outpatient Age: 66 Room: [facility] ENDO ROOM 1 Gender: Male Note Status: Finalized Instrument Name: Jasper Riling [id number] Procedure:             Colonoscopy Indications:           Screening for colorectal malignant neoplasm Providers:             Jonathon Bellows MD, MD Referring MD:          Jearld Fenton (Referring MD) Medicines:             Monitored Anesthesia Care Complications:         No immediate complications. Procedure:             Pre-Anesthesia Assessment:                        - Prior to the procedure, a History and Physical was                         performed, and patient medications, allergies and                         sensitivities were reviewed. The patient's tolerance                         of previous anesthesia was reviewed.                        - The risks and benefits of the procedure and the                         sedation options and risks were discussed with the                         patient. All questions were answered and informed                         consent was obtained.                        - ASA Grade Assessment: II - A patient with mild                         systemic disease.                        After obtaining informed consent, the colonoscope was                         passed under direct vision. Throughout the procedure,                         the patient's blood pressure, pulse, and oxygen                         saturations were monitored continuously. The                         Colonoscope was introduced  through the anus and                         advanced to the the cecum, identified by the                         appendiceal orifice. The colonoscopy was performed                         with ease. The patient tolerated the procedure well.                          The quality of the bowel preparation was excellent.                         The ileocecal valve, appendiceal orifice, and rectum                         were photographed. Findings:      The perianal and digital rectal examinations were normal.      A 5 mm polyp was found in the transverse colon. The polyp was sessile.       The polyp was removed with a cold snare. Resection and retrieval were       complete.      Non-bleeding internal hemorrhoids were found during retroflexion. The       hemorrhoids were large and Grade I (internal hemorrhoids that do not       prolapse).      The exam was otherwise without abnormality on direct and retroflexion       views. Impression:            - One 5 mm polyp in the transverse colon, removed with                         a cold snare. Resected and retrieved.                        - Non-bleeding internal hemorrhoids.                        - The examination was otherwise normal on direct and                         retroflexion views. Recommendation:        - Discharge patient to home (with escort).                        - Resume previous diet.                        - Continue present medications.                        - Await pathology results.                        - Repeat colonoscopy for surveillance based on                         pathology results. Procedure Code(s):     ---  Professional ---                        [phone], Colonoscopy, flexible; with removal of                         tumor(s), polyp(s), or other lesion(s) by snare                         technique Diagnosis Code(s):     --- Professional ---                        Z12.11, Encounter for screening for malignant neoplasm                         of colon                        D12.3, Benign neoplasm of transverse colon (hepatic                         flexure or splenic flexure)                        K64.0, First degree hemorrhoids CPT copyright 2022  American Medical Association. All rights reserved. The codes documented in this report are preliminary and upon coder review may  be revised to meet current compliance requirements. Jonathon Bellows, MD Jonathon Bellows MD, MD 12/30/2021 8:32:25 AM This report has been signed electronically. Number of Addenda: 0 Note Initiated On: 12/30/2021 8:07 AM Scope Withdrawal Time: 0 hours 9 minutes 22 seconds  Total Procedure Duration: 0 hours 12 minutes 18 seconds  Estimated Blood Loss:  Estimated blood loss: none.      [facility]

## 2021-12-30 NOTE — Anesthesia Postprocedure Evaluation (Signed)
Anesthesia Post Note  Patient: Bryce [name]  Procedure(s) Performed: COLONOSCOPY WITH PROPOFOL  Patient location during evaluation: Endoscopy Anesthesia Type: General Level of consciousness: awake and alert Pain management: pain level controlled Vital Signs Assessment: post-procedure vital signs reviewed and stable Respiratory status: spontaneous breathing, nonlabored ventilation, respiratory function stable and patient connected to nasal cannula oxygen Cardiovascular status: blood pressure returned to baseline and stable Postop Assessment: no apparent nausea or vomiting Anesthetic complications: no   No notable events documented.   Last Vitals:  Vitals:   12/30/21 0844 12/30/21 0854  BP: 97/64 112/72  Pulse: 72 71  Resp: 16 16  Temp:    SpO2: 99% 100%    Last Pain:  Vitals:   12/30/21 0854  TempSrc:   PainSc: 0-No pain                 Precious Haws Tura Roller

## 2021-12-31 ENCOUNTER — Ambulatory Visit (INDEPENDENT_AMBULATORY_CARE_PROVIDER_SITE_OTHER): Payer: Medicare Other | Admitting: Internal Medicine

## 2021-12-31 ENCOUNTER — Encounter: Payer: Self-pay | Admitting: Internal Medicine

## 2021-12-31 VITALS — BP 129/76 | HR 77 | Temp 96.9°F | Wt 163.0 lb

## 2021-12-31 DIAGNOSIS — I10 Essential (primary) hypertension: Secondary | ICD-10-CM | POA: Diagnosis not present

## 2021-12-31 LAB — SURGICAL PATHOLOGY

## 2021-12-31 MED ORDER — SIMVASTATIN 80 MG PO TABS: 80.0000 mg | ORAL_TABLET | Freq: Every day | ORAL | 0 refills | Status: AC

## 2021-12-31 NOTE — Assessment & Plan Note (Signed)
Controlled on lisinopril Reinforced DASH diet

## 2021-12-31 NOTE — Progress Notes (Signed)
Subjective:    Patient ID: Bryce [name], male    DOB: [birth year], 66 y.o.   MRN: [id number]  HPI  Patient presents to clinic today for 2-week follow-up of HTN.  At his last visit, his BP was elevated but he had not been taking his Lisinopril as prescribed.  He restarted the medication.  His BP today is 129/76.  There is no ECG on file.  Review of Systems   Past Medical History:  Diagnosis Date   Hyperlipidemia    Hypertension    Thyroid disease     Current Outpatient Medications  Medication Sig Dispense Refill   atorvastatin (LIPITOR) 20 MG tablet Take 1 tablet (20 mg total) by mouth daily. 90 tablet 1   Cholecalciferol 25 MCG (1000 UT) tablet TAKE TWO TABLETS BY MOUTH ONCE EVERY DAY     levothyroxine (SYNTHROID) 50 MCG tablet Take 50 mcg by mouth daily before breakfast.     lisinopril (ZESTRIL) 40 MG tablet Take 40 mg by mouth daily.     No current facility-administered medications for this visit.    No Known Allergies  Family History  Problem Relation Age of Onset   Breast cancer Mother    Cancer Father        Unknown Cancer   Healthy Sister     Social History   Socioeconomic History   Marital status: Single    Spouse name: Not on file   Number of children: Not on file   Years of education: Not on file   Highest education level: Not on file  Occupational History   Not on file  Tobacco Use   Smoking status: Never   Smokeless tobacco: Never  Vaping Use   Vaping Use: Never used  Substance and Sexual Activity   Alcohol use: Never   Drug use: Never   Sexual activity: Not on file  Other Topics Concern   Not on file  Social History Narrative   Not on file   Social Determinants of Health   Financial Resource Strain: Not on file  Food Insecurity: Not on file  Transportation Needs: Not on file  Physical Activity: Not on file  Stress: Not on file  Social Connections: Not on file  Intimate Partner Violence: Not on file     Constitutional: Denies  fever, malaise, fatigue, headache or abrupt weight changes.  Respiratory: Denies difficulty breathing, shortness of breath, cough or sputum production.   Cardiovascular: Denies chest pain, chest tightness, palpitations or swelling in the hands or feet.  Musculoskeletal: Denies decrease in range of motion, difficulty with gait, muscle pain or joint pain and swelling.  Neurological: Denies dizziness, difficulty with memory, difficulty with speech or problems with balance and coordination.    No other specific complaints in a complete review of systems (except as listed in HPI above).   Objective:   Physical Exam  BP 129/76 (BP Location: Right Arm, Patient Position: Sitting, Cuff Size: Normal)   Pulse 77   Temp (!) 96.9 F (36.1 C) (Temporal)   Wt 163 lb (73.9 kg)   SpO2 99%   BMI 23.39 kg/m   Wt Readings from Last 3 Encounters:  12/30/21 157 lb 12.8 oz (71.6 kg)  12/17/21 168 lb (76.2 kg)  02/07/21 177 lb 3.2 oz (80.4 kg)    General: Appears his stated age, well developed, well nourished in NAD. Skin: Warm, dry and intact.  HEENT: Head: normal shape and size; Eyes: sclera white, no icterus, conjunctiva pink, PERRLA and  EOMs intact;  Cardiovascular: Normal rate and rhythm. S1,S2 noted.  No murmur, rubs or gallops noted. No JVD or BLE edema. Pulmonary/Chest: Normal effort and positive vesicular breath sounds. No respiratory distress. No wheezes, rales or ronchi noted.  Musculoskeletal: No difficulty with gait.  Neurological: Alert and oriented. Coordination normal.     BMET    Component Value Date/Time   NA 142 12/17/2021 1002   K 3.9 12/17/2021 1002   CL 108 12/17/2021 1002   CO2 26 12/17/2021 1002   GLUCOSE 98 12/17/2021 1002   BUN 17 12/17/2021 1002   CREATININE 1.27 12/17/2021 1002   CALCIUM 10.4 (H) 12/17/2021 1002    Lipid Panel     Component Value Date/Time   CHOL 260 (H) 12/17/2021 1002   TRIG 48 12/17/2021 1002   HDL 66 12/17/2021 1002   CHOLHDL 3.9  12/17/2021 1002   LDLCALC 179 (H) 12/17/2021 1002    CBC    Component Value Date/Time   WBC 4.6 12/17/2021 1002   RBC 5.18 12/17/2021 1002   HGB 14.9 12/17/2021 1002   HCT 44.2 12/17/2021 1002   PLT 156 12/17/2021 1002   MCV 85.3 12/17/2021 1002   MCH 28.8 12/17/2021 1002   MCHC 33.7 12/17/2021 1002   RDW 13.6 12/17/2021 1002    Hgb A1C Lab Results  Component Value Date   HGBA1C 5.6 12/17/2021            Assessment & Plan:     RTC in 5 months for follow-up of chronic conditions Webb Silversmith, NP

## 2021-12-31 NOTE — Patient Instructions (Signed)

## 2022-01-02 ENCOUNTER — Ambulatory Visit (INDEPENDENT_AMBULATORY_CARE_PROVIDER_SITE_OTHER): Payer: Medicare Other

## 2022-01-02 DIAGNOSIS — Z0000 Encounter for general adult medical examination without abnormal findings: Secondary | ICD-10-CM

## 2022-01-02 NOTE — Patient Instructions (Signed)
Health Maintenance, Male Adopting a healthy lifestyle and getting preventive care are important in promoting health and wellness. Ask your health care provider about: The right schedule for you to have regular tests and exams. Things you can do on your own to prevent diseases and keep yourself healthy. What should I know about diet, weight, and exercise? Eat a healthy diet  Eat a diet that includes plenty of vegetables, fruits, low-fat dairy products, and lean protein. Do not eat a lot of foods that are high in solid fats, added sugars, or sodium. Maintain a healthy weight Body mass index (BMI) is a measurement that can be used to identify possible weight problems. It estimates body fat based on height and weight. Your health care provider can help determine your BMI and help you achieve or maintain a healthy weight. Get regular exercise Get regular exercise. This is one of the most important things you can do for your health. Most adults should: Exercise for at least 150 minutes each week. The exercise should increase your heart rate and make you sweat (moderate-intensity exercise). Do strengthening exercises at least twice a week. This is in addition to the moderate-intensity exercise. Spend less time sitting. Even light physical activity can be beneficial. Watch cholesterol and blood lipids Have your blood tested for lipids and cholesterol at 66 years of age, then have this test every 5 years. You may need to have your cholesterol levels checked more often if: Your lipid or cholesterol levels are high. You are older than 66 years of age. You are at high risk for heart disease. What should I know about cancer screening? Many types of cancers can be detected early and may often be prevented. Depending on your health history and family history, you may need to have cancer screening at various ages. This may include screening for: Colorectal cancer. Prostate cancer. Skin cancer. Lung  cancer. What should I know about heart disease, diabetes, and high blood pressure? Blood pressure and heart disease High blood pressure causes heart disease and increases the risk of stroke. This is more likely to develop in people who have high blood pressure readings or are overweight. Talk with your health care provider about your target blood pressure readings. Have your blood pressure checked: Every 3-5 years if you are 18-39 years of age. Every year if you are 40 years old or older. If you are between the ages of 65 and 75 and are a current or former smoker, ask your health care provider if you should have a one-time screening for abdominal aortic aneurysm (AAA). Diabetes Have regular diabetes screenings. This checks your fasting blood sugar level. Have the screening done: Once every three years after age 45 if you are at a normal weight and have a low risk for diabetes. More often and at a younger age if you are overweight or have a high risk for diabetes. What should I know about preventing infection? Hepatitis B If you have a higher risk for hepatitis B, you should be screened for this virus. Talk with your health care provider to find out if you are at risk for hepatitis B infection. Hepatitis C Blood testing is recommended for: Everyone born from 1945 through 1965. Anyone with known risk factors for hepatitis C. Sexually transmitted infections (STIs) You should be screened each year for STIs, including gonorrhea and chlamydia, if: You are sexually active and are younger than 66 years of age. You are older than 66 years of age and your   health care provider tells you that you are at risk for this type of infection. Your sexual activity has changed since you were last screened, and you are at increased risk for chlamydia or gonorrhea. Ask your health care provider if you are at risk. Ask your health care provider about whether you are at high risk for HIV. Your health care provider  may recommend a prescription medicine to help prevent HIV infection. If you choose to take medicine to prevent HIV, you should first get tested for HIV. You should then be tested every 3 months for as long as you are taking the medicine. Follow these instructions at home: Alcohol use Do not drink alcohol if your health care provider tells you not to drink. If you drink alcohol: Limit how much you have to 0-2 drinks a day. Know how much alcohol is in your drink. In the [place] one drink equals one 12 oz bottle of beer (355 mL), one 5 oz glass of wine (148 mL), or one 1 oz glass of hard liquor (44 mL). Lifestyle Do not use any products that contain nicotine or tobacco. These products include cigarettes, chewing tobacco, and vaping devices, such as e-cigarettes. If you need help quitting, ask your health care provider. Do not use street drugs. Do not share needles. Ask your health care provider for help if you need support or information about quitting drugs. General instructions Schedule regular health, dental, and eye exams. Stay current with your vaccines. Tell your health care provider if: You often feel depressed. You have ever been abused or do not feel safe at home. Summary Adopting a healthy lifestyle and getting preventive care are important in promoting health and wellness. Follow your health care provider's instructions about healthy diet, exercising, and getting tested or screened for diseases. Follow your health care provider's instructions on monitoring your cholesterol and blood pressure. This information is not intended to replace advice given to you by your health care provider. Make sure you discuss any questions you have with your health care provider. Document Revised: 05/28/2020 Document Reviewed: 05/28/2020 Elsevier Patient Education  2023 [employer]  

## 2022-01-02 NOTE — Progress Notes (Addendum)
I connected with  [name] on 01/02/22 by a audio enabled telemedicine application and verified that I am speaking with the correct person using two identifiers.  Patient Location: Home  Provider Location: Office/Clinic  I discussed the limitations of evaluation and management by telemedicine. The patient expressed understanding and agreed to proceed.   Subjective:   Bryce [name] is a 66 y.o. male who presents for Medicare Annual/Subsequent preventive examination.  Review of Systems    Per HPI unless specifically indicated below.  Cardiac Risk Factors include: advanced age (>41mn, >>19women);male gender, hypertension, and hyperlipidemia.          Objective:        12/31/2021   10:02 AM 12/31/2021    9:54 AM 12/30/2021    8:54 AM  Vitals with BMI  Weight  163 lbs   BMI  209.73  Systolic [id number] Diastolic 76 92 72  Pulse  77 71    Today's Vitals   01/02/22 1310  PainSc: 0-No pain   There is no height or weight on file to calculate BMI.     12/30/2021    7:43 AM 03/30/2020   10:02 AM 03/20/2020    2:40 PM  Advanced Directives  Does Patient Have a Medical Advance Directive? No No No  Would patient like information on creating a medical advance directive?  No - Patient declined No - Patient declined    Current Medications (verified) Outpatient Encounter Medications as of 01/02/2022  Medication Sig   Cholecalciferol 25 MCG (1000 UT) tablet TAKE TWO TABLETS BY MOUTH ONCE EVERY DAY   levothyroxine (SYNTHROID) 50 MCG tablet Take 50 mcg by mouth daily before breakfast.   lisinopril (ZESTRIL) 40 MG tablet Take 40 mg by mouth daily.   simvastatin (ZOCOR) 80 MG tablet Take 1 tablet (80 mg total) by mouth daily.   No facility-administered encounter medications on file as of 01/02/2022.    Allergies (verified) Patient has no known allergies.   History: Past Medical History:  Diagnosis Date   Hyperlipidemia    Hypertension    Thyroid disease     Past Surgical History:  Procedure Laterality Date   COLONOSCOPY WITH PROPOFOL N/A 12/30/2021   Procedure: COLONOSCOPY WITH PROPOFOL;  Surgeon: AJonathon Bellows MD;  [facility]  Service: Gastroenterology;  Laterality: N/A;   Family History  Problem Relation Age of Onset   Breast cancer Mother    Cancer Father        Unknown Cancer   Healthy Sister    Social History   Socioeconomic History   Marital status: Single    Spouse name: Not on file   Number of children: Not on file   Years of education: Not on file   Highest education level: Not on file  Occupational History   Occupation: Retired  Tobacco Use   Smoking status: Never   Smokeless tobacco: Never  Vaping Use   Vaping Use: Never used  Substance and Sexual Activity   Alcohol use: Never   Drug use: Never   Sexual activity: Not on file  Other Topics Concern   Not on file  Social History Narrative   Not on file   Social Determinants of Health   Financial Resource Strain: [place] (01/02/2022)   Overall Financial Resource Strain (CARDIA)    Difficulty of Paying Living Expenses: Not hard at all  Food Insecurity: No Food Insecurity (01/02/2022)   Hunger Vital Sign    Worried  About Running Out of Food in the Last Year: Never true    Ran Out of Food in the Last Year: Never true  Transportation Needs: No Transportation Needs (01/02/2022)   PRAPARE - Hydrologist (Medical): No    Lack of Transportation (Non-Medical): No  Physical Activity: Sufficiently Active (01/02/2022)   Exercise Vital Sign    Days of Exercise per Week: 5 days    Minutes of Exercise per Session: 60 min  Stress: No Stress Concern Present (01/02/2022)   [place]    Feeling of Stress : Not at all  Social Connections: Moderately Integrated (01/02/2022)   Social Connection and Isolation Panel [NHANES]    Frequency of Communication with  Friends and Family: More than three times a week    Frequency of Social Gatherings with Friends and Family: More than three times a week    Attends Religious Services: More than 4 times per year    Active Member of [employer] or Organizations: No    Attends Music therapist: More than 4 times per year    Marital Status: Divorced    Tobacco Counseling Counseling given: Not Answered   Clinical Intake:  Pre-visit preparation completed: No  Pain : No/denies pain Pain Score: 0-No pain     Nutritional Status: BMI of 19-24  Normal Diabetes: No  How often do you need to have someone help you when you read instructions, pamphlets, or other written materials from your doctor or pharmacy?: 1 - Never  Diabetic?No    Interpreter Needed?: No  Information entered by :: Donnie Mesa, CMA   Activities of Daily Living    01/02/2022    1:07 PM 12/17/2021   10:02 AM  In your present state of health, do you have any difficulty performing the following activities:  Hearing? 1 0  Comment tinnutis   Vision? [place]   Difficulty concentrating or making decisions? 0 0  Walking or climbing stairs? 0 0  Dressing or bathing? 0 0  Doing errands, shopping? 0 0    Patient Care Team: Jearld Fenton, NP as PCP - General (Internal Medicine)  Indicate any recent Medical Services you may have received from other than [facility] providers in the past year (date may be approximate).    The pt was seen for a colonoscopy on 12/30/21 at [facility]  Assessment:   This is a routine wellness examination for [name].  Hearing/Vision screen Denies any vision issues. Denies any hearing issues.   Dietary issues and exercise activities discussed: Current Exercise Habits: Structured exercise class, Type of exercise: walking, Time (Minutes): 60, Frequency (Times/Week): 5, Weekly Exercise (Minutes/Week): 300, Intensity: Moderate, Exercise limited by: None identified   Goals  Addressed             This Visit's Progress    Stay Active and Independent           Why is this important?   Regular activity or exercise is important to managing back pain.  Activity helps to keep your muscles strong.  You will sleep better and feel more relaxed.  You will have more energy and feel less stressed.  If you are not active now, start slowly. Little changes make a big difference.  Rest, but not too much.  Stay as active as you can and listen to your body's signals.  Depression Screen    01/02/2022    1:06 PM 12/17/2021   10:01 AM 02/07/2021    2:08 PM  PHQ 2/9 Scores  PHQ - 2 Score 0 0 0  PHQ- 9 Score   0    Fall Risk    01/02/2022    1:06 PM 12/17/2021   10:01 AM 02/07/2021    2:08 PM  [place] in the past year? 0 0 0  Number falls in past yr: 0  0  Injury with Fall? 0 0 0  Risk for fall due to : No Fall Risks  No Fall Risks  Follow up Falls evaluation completed  Falls evaluation completed    [place]:  Any stairs in or around the home? Yes  If so, are there any without handrails? No  Home free of loose throw rugs in walkways, pet beds, electrical cords, etc? Yes  Adequate lighting in your home to reduce risk of falls? Yes   ASSISTIVE DEVICES UTILIZED TO PREVENT FALLS:  Life alert? No  Use of a cane, walker or w/c? No  Grab bars in the bathroom? No  Shower chair or bench in shower? No  Elevated toilet seat or a handicapped toilet? Yes  TIMED UP AND GO:  Was the test performed? No .    Cognitive Function:        01/02/2022    1:08 PM  6CIT Screen  What Year? 0 points  What month? 0 points  What time? 0 points  Count back from 20 0 points  Months in reverse 0 points  Repeat phrase 8 points  Total Score 8 points    Immunizations Immunization History  Administered Date(s) Administered   PFIZER(Purple Top)SARS-COV-2 Vaccination 04/05/2019, 04/26/2019, 11/17/2019    PNEUMOCOCCAL CONJUGATE-20 12/17/2021   Pfizer Covid-19 Vaccine Bivalent Booster 34yr & up 11/19/2020   Tdap 02/28/2011, 03/20/2020   Zoster Recombinat (Shingrix) 09/06/2019    TDAP status: Up to date  Flu Vaccine status: Due, Education has been provided regarding the importance of this vaccine. Advised may receive this vaccine at local pharmacy or Health Dept. Aware to provide a copy of the vaccination record if obtained from local pharmacy or Health Dept. Verbalized acceptance and understanding.  Pneumococcal vaccine status: Up to date  Covid-19 vaccine status: Information provided on how to obtain vaccines.   Qualifies for Shingles Vaccine? Yes   Zostavax completed Yes   Shingrix Completed?: No.    Education has been provided regarding the importance of this vaccine. Patient has been advised to call insurance company to determine out of pocket expense if they have not yet received this vaccine. Advised may also receive vaccine at local pharmacy or Health Dept. Verbalized acceptance and understanding.  Screening Tests Health Maintenance  Topic Date Due   Zoster Vaccines- Shingrix (2 of 2) 11/01/2019   COVID-19 Vaccine (5 - 2023-24 season) 01/02/2022 (Originally 09/20/2021)   INFLUENZA VACCINE  04/20/2022 (Originally 08/20/2021)   Medicare Annual Wellness (AWV)  01/03/2023   DTaP/Tdap/Td (3 - Td or Tdap) 03/21/2030   COLONOSCOPY (Pts 45-471yrInsurance coverage will need to be confirmed)  12/31/2031   Pneumonia Vaccine 6549Years old  Completed   Hepatitis C Screening  Completed   HPV VACCINES  Aged Out    Health Maintenance  Health Maintenance Due  Topic Date Due   Zoster Vaccines- Shingrix (2 of 2) 11/01/2019    Colorectal cancer screening: Type of screening: Colonoscopy. Completed  12/30/2021. Repeat every 10 years  Lung Cancer Screening: (Low Dose CT Chest recommended if Age 27-80 years, 30 pack-year currently smoking OR have quit w/in 15years.) does not qualify.   Lung  Cancer Screening Referral: not applicable   Additional Screening:  Hepatitis C Screening: does qualify; Completed 12/17/21  Vision Screening: Recommended annual ophthalmology exams for early detection of glaucoma and other disorders of the eye. Is the patient up to date with their annual eye exam?  Yes  Who is the provider or what is the name of the office in which the patient attends annual eye exams? [facility]  If pt is not established with a provider, would they like to be referred to a provider to establish care? No .   Dental Screening: Recommended annual dental exams for proper oral hygiene  Community Resource Referral / Chronic Care Management: CRR required this visit?  No   CCM required this visit?  No      Plan:     I have personally reviewed and noted the following in the patient's chart:   Medical and social history Use of alcohol, tobacco or illicit drugs  Current medications and supplements including opioid prescriptions. Patient is not currently taking opioid prescriptions. Functional ability and status Nutritional status Physical activity Advanced directives List of other physicians Hospitalizations, surgeries, and ER visits in previous 12 months Vitals Screenings to include cognitive, depression, and falls Referrals and appointments  In addition, I have reviewed and discussed with patient certain preventive protocols, quality metrics, and best practice recommendations. A written personalized care plan for preventive services as well as general preventive health recommendations were provided to patient.    Mr. Uecker , Thank you for taking time to come for your Medicare Wellness Visit. I appreciate your ongoing commitment to your health goals. Please review the following plan we discussed and let me know if I can assist you in the future.   These are the goals we discussed:  Goals      Stay Active and Independent         Why is this  important?   Regular activity or exercise is important to managing back pain.  Activity helps to keep your muscles strong.  You will sleep better and feel more relaxed.  You will have more energy and feel less stressed.  If you are not active now, start slowly. Little changes make a big difference.  Rest, but not too much.  Stay as active as you can and listen to your body's signals.             This is a list of the screening recommended for you and due dates:  Health Maintenance  Topic Date Due   Zoster (Shingles) Vaccine (2 of 2) 11/01/2019   COVID-19 Vaccine (5 - 2023-24 season) 01/02/2022*   Flu Shot  04/20/2022*   Medicare Annual Wellness Visit  01/03/2023   DTaP/Tdap/Td vaccine (3 - Td or Tdap) 03/21/2030   Colon Cancer Screening  12/31/2031   Pneumonia Vaccine  Completed   Hepatitis C Screening: USPSTF Recommendation to screen - Ages 18-79 yo.  Completed   HPV Vaccine  Aged Out  *Topic was postponed. The date shown is not the original due date.     Mr. Kluth , Thank you for taking time to come for your Medicare Wellness Visit. I appreciate your ongoing commitment to your health goals. Please review the following plan we discussed and let me know if I can  assist you in the future.   These are the goals we discussed:  Goals      Stay Active and Independent         Why is this important?   Regular activity or exercise is important to managing back pain.  Activity helps to keep your muscles strong.  You will sleep better and feel more relaxed.  You will have more energy and feel less stressed.  If you are not active now, start slowly. Little changes make a big difference.  Rest, but not too much.  Stay as active as you can and listen to your body's signals.             This is a list of the screening recommended for you and due dates:  Health Maintenance  Topic Date Due   Zoster (Shingles) Vaccine (2 of 2) 11/01/2019   COVID-19 Vaccine (5 - 2023-24 season)  01/02/2022*   Flu Shot  04/20/2022*   Medicare Annual Wellness Visit  01/03/2023   DTaP/Tdap/Td vaccine (3 - Td or Tdap) 03/21/2030   Colon Cancer Screening  12/31/2031   Pneumonia Vaccine  Completed   Hepatitis C Screening: USPSTF Recommendation to screen - Ages 18-79 yo.  Completed   HPV Vaccine  Aged Out  *Topic was postponed. The date shown is not the original due date.    Wilson Singer, [place]   01/02/2022   Nurse Notes: Approximately 30 minute Non- Face -To-Face Medicare Wellness Visit

## 2022-01-09 ENCOUNTER — Encounter: Payer: Self-pay | Admitting: Gastroenterology

## 2022-05-15 DIAGNOSIS — H43811 Vitreous degeneration, right eye: Secondary | ICD-10-CM | POA: Diagnosis not present

## 2022-05-15 DIAGNOSIS — H2513 Age-related nuclear cataract, bilateral: Secondary | ICD-10-CM | POA: Diagnosis not present

## 2022-06-02 ENCOUNTER — Encounter: Payer: 59 | Admitting: Internal Medicine

## 2022-06-02 NOTE — Progress Notes (Deleted)
Subjective:    Patient ID: Bryce [name], male    DOB: [birth year], 67 y.o.   MRN: [id number]  HPI  Patient presents to clinic today for 21-month follow-up of chronic conditions.  Hypothyroidism: He denies any issues on his current dose of Levothyroxine.  He does not follow with endocrinology.  HTN: His BP today is.  He is taking Lisinopril as prescribed.  There is no ECG on file.  HLD: His last LDL was 179, triglycerides 48, 11/2021.  He denies myalgias on Simvastatin.  He tries to consume a low-fat diet.  Review of Systems     Past Medical History:  Diagnosis Date   Hyperlipidemia    Hypertension    Thyroid disease     Current Outpatient Medications  Medication Sig Dispense Refill   Cholecalciferol 25 MCG (1000 UT) tablet TAKE TWO TABLETS BY MOUTH ONCE EVERY DAY     levothyroxine (SYNTHROID) 50 MCG tablet Take 50 mcg by mouth daily before breakfast.     lisinopril (ZESTRIL) 40 MG tablet Take 40 mg by mouth daily.     simvastatin (ZOCOR) 80 MG tablet Take 1 tablet (80 mg total) by mouth daily. 90 tablet 0   No current facility-administered medications for this visit.    No Known Allergies  Family History  Problem Relation Age of Onset   Breast cancer Mother    Cancer Father        Unknown Cancer   Healthy Sister     Social History   Socioeconomic History   Marital status: Single    Spouse name: Not on file   Number of children: Not on file   Years of education: Not on file   Highest education level: Not on file  Occupational History   Occupation: Retired  Tobacco Use   Smoking status: Never   Smokeless tobacco: Never  Vaping Use   Vaping Use: Never used  Substance and Sexual Activity   Alcohol use: Never   Drug use: Never   Sexual activity: Not on file  Other Topics Concern   Not on file  Social History Narrative   Not on file   Social Determinants of Health   Financial Resource Strain: Low Risk  (01/02/2022)   Overall Financial Resource  Strain (CARDIA)    Difficulty of Paying Living Expenses: Not hard at all  Food Insecurity: No Food Insecurity (01/02/2022)   Hunger Vital Sign    Worried About Running Out of Food in the Last Year: Never true    Ran Out of Food in the Last Year: Never true  Transportation Needs: No Transportation Needs (01/02/2022)   PRAPARE - Administrator, Civil Service (Medical): No    Lack of Transportation (Non-Medical): No  Physical Activity: Sufficiently Active (01/02/2022)   Exercise Vital Sign    Days of Exercise per Week: 5 days    Minutes of Exercise per Session: 60 min  Stress: No Stress Concern Present (01/02/2022)   [employer] of Occupational Health - Occupational Stress Questionnaire    Feeling of Stress : Not at all  Social Connections: Moderately Integrated (01/02/2022)   Social Connection and Isolation Panel [NHANES]    Frequency of Communication with Friends and Family: More than three times a week    Frequency of Social Gatherings with Friends and Family: More than three times a week    Attends Religious Services: More than 4 times per year    Active Member of Clubs or Organizations: No  Attends Banker Meetings: More than 4 times per year    Marital Status: Divorced  Intimate Partner Violence: Not At Risk (01/02/2022)   Humiliation, Afraid, Rape, and Kick questionnaire    Fear of Current or Ex-Partner: No    Emotionally Abused: No    Physically Abused: No    Sexually Abused: No     Constitutional: Denies fever, malaise, fatigue, headache or abrupt weight changes.  HEENT: Denies eye pain, eye redness, ear pain, ringing in the ears, wax buildup, runny nose, nasal congestion, bloody nose, or sore throat. Respiratory: Denies difficulty breathing, shortness of breath, cough or sputum production.   Cardiovascular: Denies chest pain, chest tightness, palpitations or swelling in the hands or feet.  Gastrointestinal: Denies abdominal pain, bloating,  constipation, diarrhea or blood in the stool.  GU: Denies urgency, frequency, pain with urination, burning sensation, blood in urine, odor or discharge. Musculoskeletal: Denies decrease in range of motion, difficulty with gait, muscle pain or joint pain and swelling.  Skin: Denies redness, rashes, lesions or ulcercations.  Neurological: Denies dizziness, difficulty with memory, difficulty with speech or problems with balance and coordination.  Psych: Denies anxiety, depression, SI/HI.  No other specific complaints in a complete review of systems (except as listed in HPI above).  Objective:   Physical Exam   There were no vitals taken for this visit. Wt Readings from Last 3 Encounters:  12/31/21 163 lb (73.9 kg)  12/30/21 157 lb 12.8 oz (71.6 kg)  12/17/21 168 lb (76.2 kg)    General: Appears their stated age, well developed, well nourished in NAD. Skin: Warm, dry and intact. No rashes, lesions or ulcerations noted. HEENT: Head: normal shape and size; Eyes: sclera white, no icterus, conjunctiva pink, PERRLA and EOMs intact; Ears: Tm's gray and intact, normal light reflex; Nose: mucosa pink and moist, septum midline; Throat/Mouth: Teeth present, mucosa pink and moist, no exudate, lesions or ulcerations noted.  Neck:  Neck supple, trachea midline. No masses, lumps or thyromegaly present.  Cardiovascular: Normal rate and rhythm. S1,S2 noted.  No murmur, rubs or gallops noted. No JVD or BLE edema. No carotid bruits noted. Pulmonary/Chest: Normal effort and positive vesicular breath sounds. No respiratory distress. No wheezes, rales or ronchi noted.  Abdomen: Soft and nontender. Normal bowel sounds. No distention or masses noted. Liver, spleen and kidneys non palpable. Musculoskeletal: Normal range of motion. No signs of joint swelling. No difficulty with gait.  Neurological: Alert and oriented. Cranial nerves II-XII grossly intact. Coordination normal.  Psychiatric: Mood and affect normal.  Behavior is normal. Judgment and thought content normal.     BMET    Component Value Date/Time   NA 142 12/17/2021 1002   K 3.9 12/17/2021 1002   CL 108 12/17/2021 1002   CO2 26 12/17/2021 1002   GLUCOSE 98 12/17/2021 1002   BUN 17 12/17/2021 1002   CREATININE 1.27 12/17/2021 1002   CALCIUM 10.4 (H) 12/17/2021 1002    Lipid Panel     Component Value Date/Time   CHOL 260 (H) 12/17/2021 1002   TRIG 48 12/17/2021 1002   HDL 66 12/17/2021 1002   CHOLHDL 3.9 12/17/2021 1002   LDLCALC 179 (H) 12/17/2021 1002    CBC    Component Value Date/Time   WBC 4.6 12/17/2021 1002   RBC 5.18 12/17/2021 1002   HGB 14.9 12/17/2021 1002   HCT 44.2 12/17/2021 1002   PLT 156 12/17/2021 1002   MCV 85.3 12/17/2021 1002   MCH 28.8 12/17/2021  1002   MCHC 33.7 12/17/2021 1002   RDW 13.6 12/17/2021 1002    Hgb A1C Lab Results  Component Value Date   HGBA1C 5.6 12/17/2021           Assessment & Plan:      RTC in 6 months for your annual exam Nicki Reaper, NP

## 2022-08-01 IMAGING — CR DG KNEE 1-2V*L*
1 series · 2 of 2 positions shown · non-contrast
Comparison: None.

CLINICAL DATA: The patient suffered a laceration to the anterior
aspect of the left knee with a chain saw today. Initial encounter.

EXAM:
LEFT KNEE - 1-2 VIEW

[Series 1: dg knee 1-2 views left · 0.14mm/px · 2 of 2 slices shown]
[im 1/2]
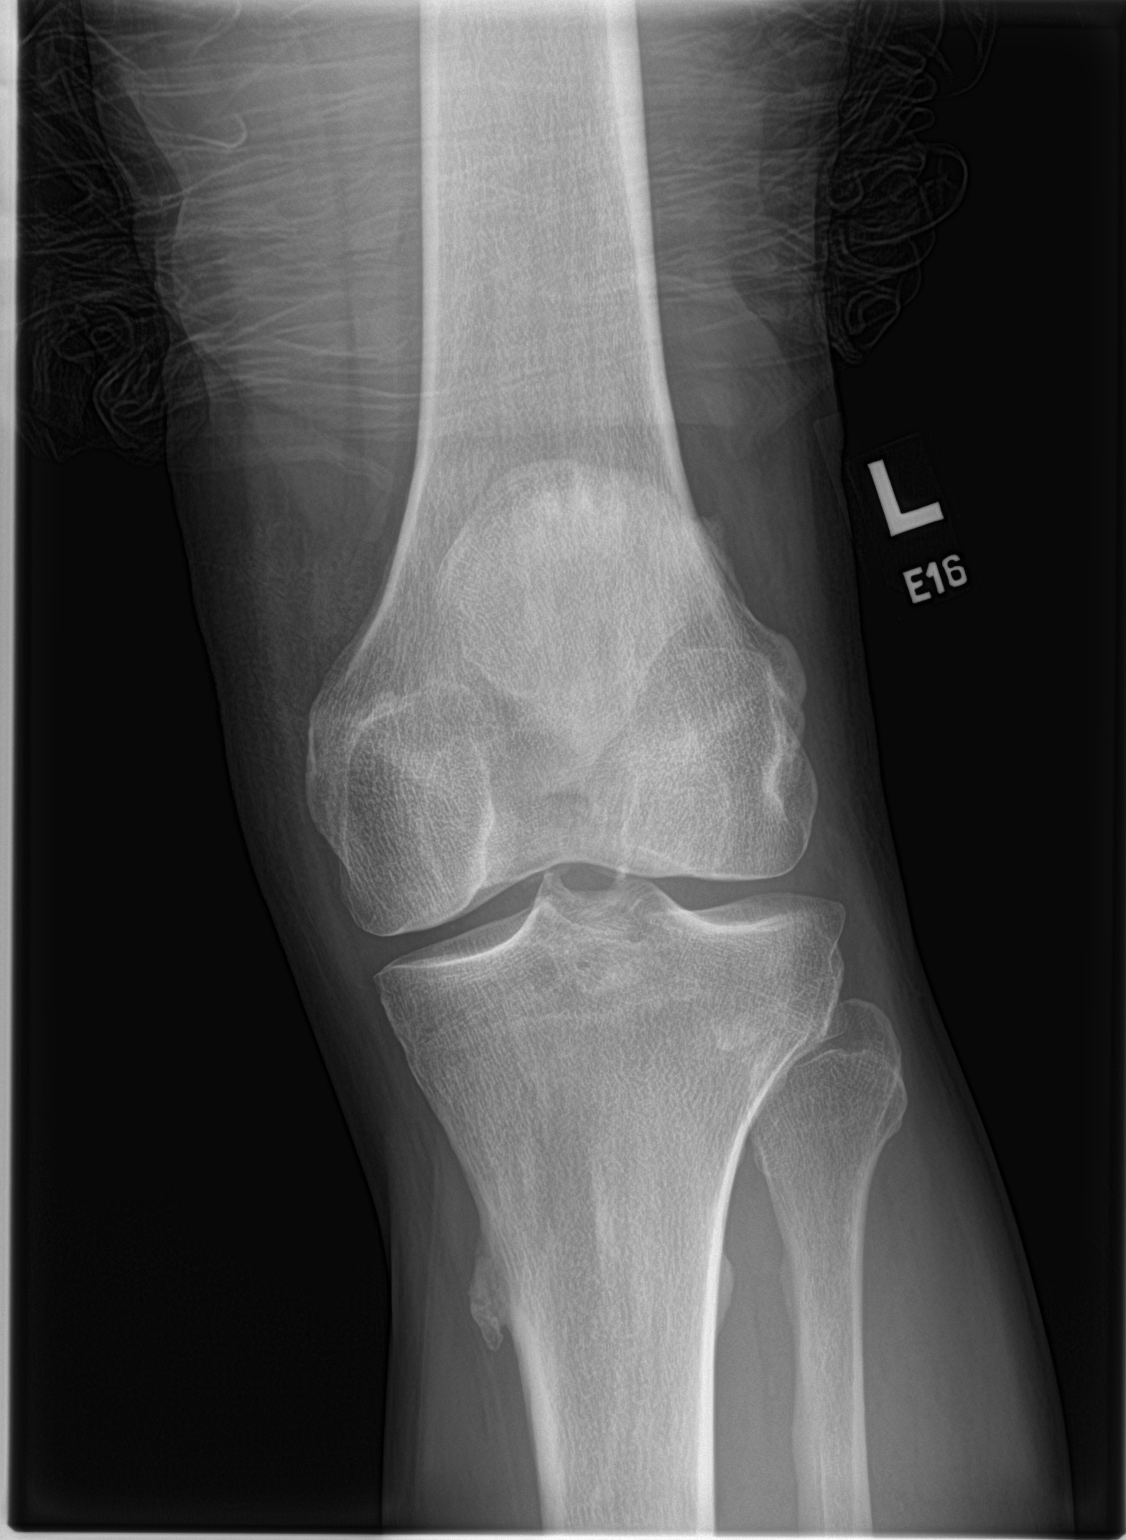
[im 2/2]
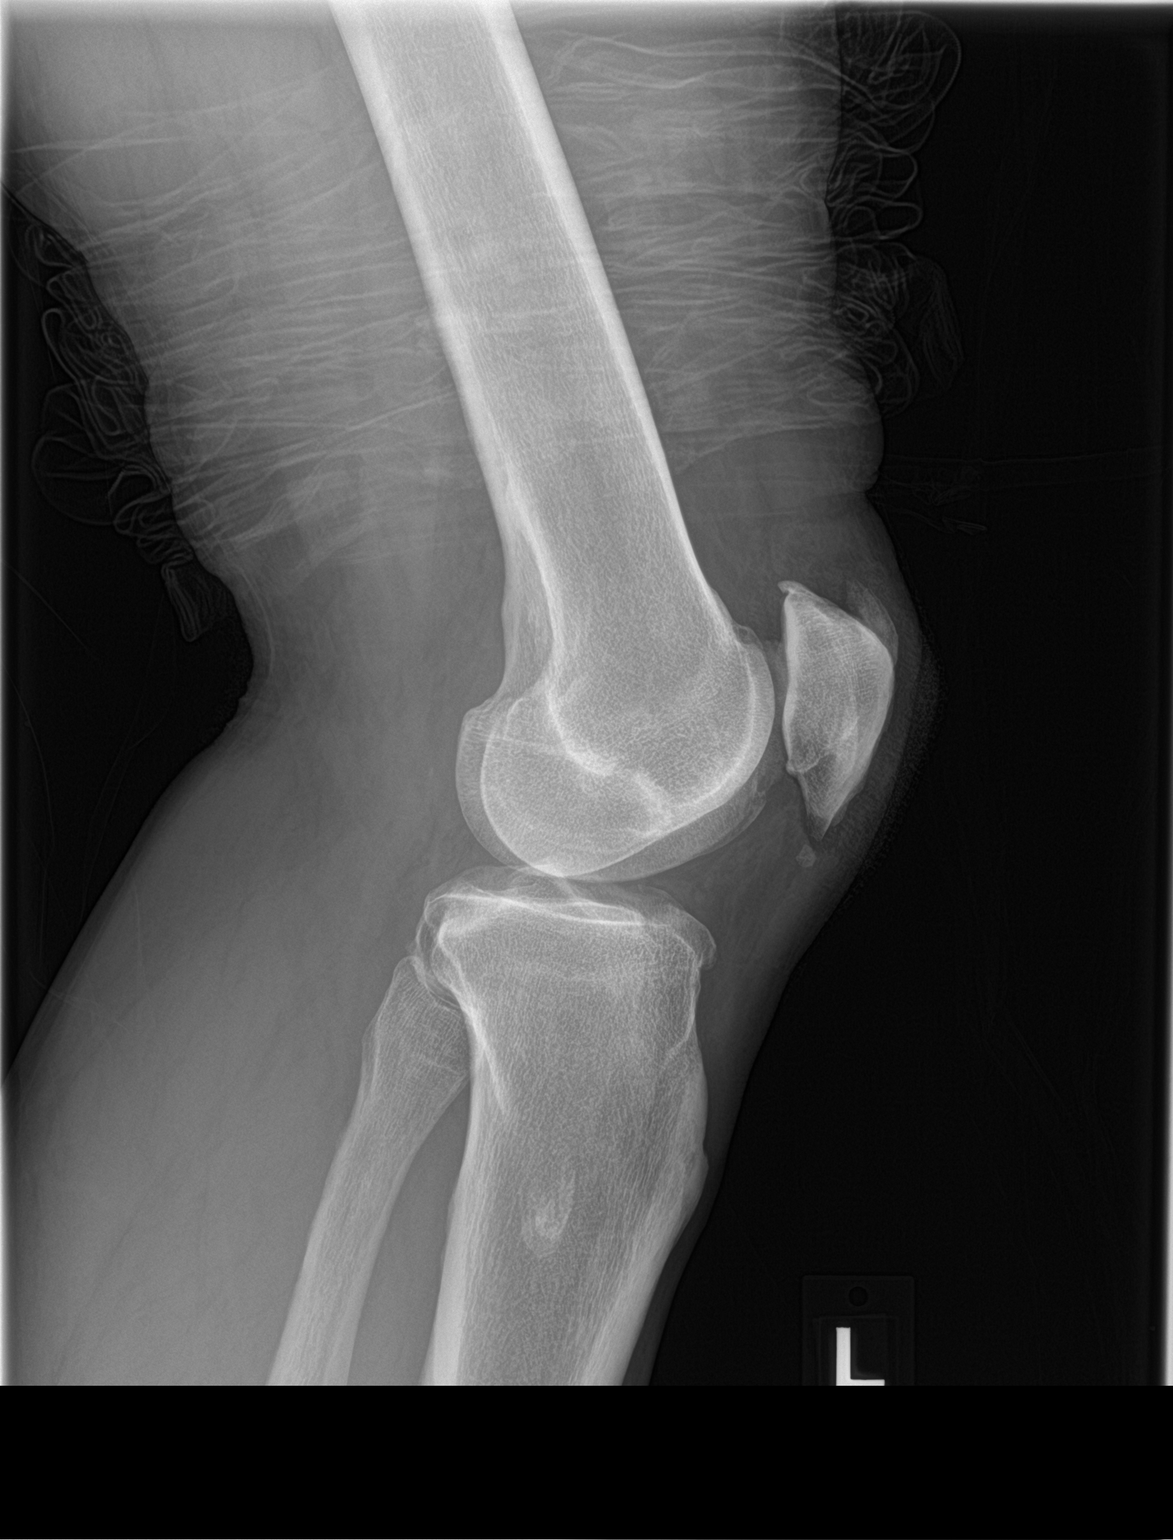

[2 of 2 positions shown; findings below may reference images not displayed]

FINDINGS: There is no acute bony or joint abnormality. Bandaging is seen
anterior to the patella. No radiopaque foreign body is identified.
Mild patellofemoral osteoarthritis is seen and there is a small spur
at the quadriceps tendon insertion on the patella. Small exostosis
along the medial metaphysis of the tibia measures 1.1 cm AP x 2.3 cm
craniocaudal x 0.9 cm transverse.
IMPRESSION: Laceration without underlying fracture or foreign body.

Mild patellofemoral degenerative change.

Small, benign exostosis medial metaphysis of the tibia.

## 2022-08-26 ENCOUNTER — Encounter: Payer: 59 | Admitting: Internal Medicine

## 2022-09-04 ENCOUNTER — Ambulatory Visit (INDEPENDENT_AMBULATORY_CARE_PROVIDER_SITE_OTHER): Payer: Medicare Other | Admitting: Internal Medicine

## 2022-09-04 ENCOUNTER — Encounter: Payer: Self-pay | Admitting: Internal Medicine

## 2022-09-04 VITALS — BP 118/82 | HR 84 | Temp 96.2°F | Ht 69.0 in | Wt 168.0 lb

## 2022-09-04 DIAGNOSIS — E7800 Pure hypercholesterolemia, unspecified: Secondary | ICD-10-CM

## 2022-09-04 DIAGNOSIS — E039 Hypothyroidism, unspecified: Secondary | ICD-10-CM

## 2022-09-04 DIAGNOSIS — R7309 Other abnormal glucose: Secondary | ICD-10-CM

## 2022-09-04 DIAGNOSIS — I10 Essential (primary) hypertension: Secondary | ICD-10-CM | POA: Diagnosis not present

## 2022-09-04 NOTE — Assessment & Plan Note (Signed)
TSH and free T4 today We will adjust levothyroxine if needed based on labs 

## 2022-09-04 NOTE — Progress Notes (Signed)
Subjective:    Patient ID: Bryce [name], male    DOB: 1955-10-09, 67 y.o.   MRN: [id number]  HPI  Patient presents to clinic today for follow-up of chronic conditions.  Hypothyroidism: He denies any issues on his current dose of levothyroxine.  He does not follow with endocrinology.  HTN: His BP today is 118/82.  He is taking lisinopril as prescribed.  There is no ECG on file.  HLD: His last LDL was 179, triglycerides 48, 11/2021.  He denies myalgias on simvastatin.  He tries to consume a low-fat diet.  Review of Systems     Past Medical History:  Diagnosis Date   Hyperlipidemia    Hypertension    Thyroid disease     Current Outpatient Medications  Medication Sig Dispense Refill   Cholecalciferol 25 MCG (1000 UT) tablet TAKE TWO TABLETS BY MOUTH ONCE EVERY DAY     levothyroxine (SYNTHROID) 50 MCG tablet Take 50 mcg by mouth daily before breakfast.     lisinopril (ZESTRIL) 40 MG tablet Take 40 mg by mouth daily.     simvastatin (ZOCOR) 80 MG tablet Take 1 tablet (80 mg total) by mouth daily. 90 tablet 0   No current facility-administered medications for this visit.    No Known Allergies  Family History  Problem Relation Age of Onset   Breast cancer Mother    Cancer Father        Unknown Cancer   Healthy Sister     Social History   Socioeconomic History   Marital status: Single    Spouse name: Not on file   Number of children: Not on file   Years of education: Not on file   Highest education level: Not on file  Occupational History   Occupation: Retired  Tobacco Use   Smoking status: Never   Smokeless tobacco: Never  Vaping Use   Vaping status: Never Used  Substance and Sexual Activity   Alcohol use: Never   Drug use: Never   Sexual activity: Not on file  Other Topics Concern   Not on file  Social History Narrative   Not on file   Social Determinants of Health   Financial Resource Strain: Low Risk  (01/02/2022)   Overall Financial Resource  Strain (CARDIA)    Difficulty of Paying Living Expenses: Not hard at all  Food Insecurity: No Food Insecurity (01/02/2022)   Hunger Vital Sign    Worried About Running Out of Food in the Last Year: Never true    Ran Out of Food in the Last Year: Never true  Transportation Needs: No Transportation Needs (01/02/2022)   PRAPARE - Administrator, Civil Service (Medical): No    Lack of Transportation (Non-Medical): No  Physical Activity: Sufficiently Active (01/02/2022)   Exercise Vital Sign    Days of Exercise per Week: 5 days    Minutes of Exercise per Session: 60 min  Stress: No Stress Concern Present (01/02/2022)   [employer] of Occupational Health - Occupational Stress Questionnaire    Feeling of Stress : Not at all  Social Connections: Moderately Integrated (01/02/2022)   Social Connection and Isolation Panel [NHANES]    Frequency of Communication with Friends and Family: More than three times a week    Frequency of Social Gatherings with Friends and Family: More than three times a week    Attends Religious Services: More than 4 times per year    Active Member of Clubs or Organizations: No  Attends Banker Meetings: More than 4 times per year    Marital Status: Divorced  Intimate Partner Violence: Not At Risk (01/02/2022)   Humiliation, Afraid, Rape, and Kick questionnaire    Fear of Current or Ex-Partner: No    Emotionally Abused: No    Physically Abused: No    Sexually Abused: No     Constitutional: Denies fever, malaise, fatigue, headache or abrupt weight changes.  HEENT: Denies eye pain, eye redness, ear pain, ringing in the ears, wax buildup, runny nose, nasal congestion, bloody nose, or sore throat. Respiratory: Denies difficulty breathing, shortness of breath, cough or sputum production.   Cardiovascular: Denies chest pain, chest tightness, palpitations or swelling in the hands or feet.  Gastrointestinal: Denies abdominal pain, bloating,  constipation, diarrhea or blood in the stool.  GU: Denies urgency, frequency, pain with urination, burning sensation, blood in urine, odor or discharge. Musculoskeletal: Denies decrease in range of motion, difficulty with gait, muscle pain or joint pain and swelling.  Skin: Denies redness, rashes, lesions or ulcercations.  Neurological: Denies dizziness, difficulty with memory, difficulty with speech or problems with balance and coordination.  Psych: Denies anxiety, depression, SI/HI.  No other specific complaints in a complete review of systems (except as listed in HPI above).  Objective:   Physical Exam   BP 118/82 (BP Location: Right Arm, Patient Position: Sitting, Cuff Size: Normal)   Pulse 84   Temp (!) 96.2 F (35.7 C) (Temporal)   Ht 5\' 9"  (1.753 m)   Wt 168 lb (76.2 kg)   SpO2 99%   BMI 24.81 kg/m   Wt Readings from Last 3 Encounters:  12/31/21 163 lb (73.9 kg)  12/30/21 157 lb 12.8 oz (71.6 kg)  12/17/21 168 lb (76.2 kg)    General: Appears his stated age, well developed, well nourished in NAD. Skin: Warm, dry and intact.  HEENT: Head: normal shape and size; Eyes: sclera white, no icterus, conjunctiva pink, PERRLA and EOMs intact, exophthalmos noted;  Neck:  Neck supple, trachea midline. No masses, lumps or thyromegaly present.  Cardiovascular: Normal rate and rhythm. S1,S2 noted.  No murmur, rubs or gallops noted. No JVD or BLE edema. No carotid bruits noted. Pulmonary/Chest: Normal effort and positive vesicular breath sounds. No respiratory distress. No wheezes, rales or ronchi noted.  Musculoskeletal:  No difficulty with gait.  Neurological: Alert and oriented.  Coordination normal.  Psychiatric: Mood and affect normal. Behavior is normal. Judgment and thought content normal.     BMET    Component Value Date/Time   NA 142 12/17/2021 1002   K 3.9 12/17/2021 1002   CL 108 12/17/2021 1002   CO2 26 12/17/2021 1002   GLUCOSE 98 12/17/2021 1002   BUN 17  12/17/2021 1002   CREATININE 1.27 12/17/2021 1002   CALCIUM 10.4 (H) 12/17/2021 1002    Lipid Panel     Component Value Date/Time   CHOL 260 (H) 12/17/2021 1002   TRIG 48 12/17/2021 1002   HDL 66 12/17/2021 1002   CHOLHDL 3.9 12/17/2021 1002   LDLCALC 179 (H) 12/17/2021 1002    CBC    Component Value Date/Time   WBC 4.6 12/17/2021 1002   RBC 5.18 12/17/2021 1002   HGB 14.9 12/17/2021 1002   HCT 44.2 12/17/2021 1002   PLT 156 12/17/2021 1002   MCV 85.3 12/17/2021 1002   MCH 28.8 12/17/2021 1002   MCHC 33.7 12/17/2021 1002   RDW 13.6 12/17/2021 1002    Hgb A1C Lab  Results  Component Value Date   HGBA1C 5.6 12/17/2021          Assessment & Plan:     RTC in 3 months for annual exam Nicki Reaper, NP

## 2022-09-04 NOTE — Assessment & Plan Note (Signed)
C-Met and lipid profile today Encouraged low-fat diet Continue simvastatin

## 2022-09-04 NOTE — Assessment & Plan Note (Signed)
Controlled on lisinopril C-Met today

## 2022-09-04 NOTE — Patient Instructions (Signed)

## 2022-09-05 LAB — LIPID PANEL
Cholesterol: 172 mg/dL (ref <200)
HDL: 60 mg/dL (ref >=40)
LDL Cholesterol (Calc): 86 mg/dL
Non-HDL Cholesterol (Calc): 112 mg/dL (ref <130)
Total CHOL/HDL Ratio: 2.9 (calc) (ref <5.0)
Triglycerides: 162 mg/dL — ABNORMAL HIGH (ref <150)

## 2022-09-05 LAB — CBC
HCT: 45.1 % (ref 38.5–50.0)
Hemoglobin: 15.1 g/dL (ref 13.2–17.1)
MCH: 28.5 pg (ref 27.0–33.0)
MCHC: 33.5 g/dL (ref 32.0–36.0)
MCV: 85.3 fL (ref 80.0–100.0)
MPV: 10.2 fL (ref 7.5–12.5)
Platelets: 199 10*3/uL (ref 140–400)
RBC: 5.29 10*6/uL (ref 4.20–5.80)
RDW: 12.7 % (ref 11.0–15.0)
WBC: 3.2 10*3/uL — ABNORMAL LOW (ref 3.8–10.8)

## 2022-09-05 LAB — COMPLETE METABOLIC PANEL WITH GFR
AG Ratio: 1.8 (calc) (ref 1.0–2.5)
ALT: 27 U/L (ref 9–46)
AST: 25 U/L (ref 10–35)
Albumin: 4.3 g/dL (ref 3.6–5.1)
Alkaline phosphatase (APISO): 47 U/L (ref 35–144)
BUN/Creatinine Ratio: 21 (calc) (ref 6–22)
BUN: 27 mg/dL — ABNORMAL HIGH (ref 7–25)
CO2: 25 mmol/L (ref 20–32)
Calcium: 10.3 mg/dL (ref 8.6–10.3)
Chloride: 105 mmol/L (ref 98–110)
Creat: 1.31 mg/dL (ref 0.70–1.35)
Globulin: 2.4 g/dL (calc) (ref 1.9–3.7)
Glucose, Bld: 89 mg/dL (ref 65–99)
Potassium: 4.5 mmol/L (ref 3.5–5.3)
Sodium: 138 mmol/L (ref 135–146)
Total Bilirubin: 0.8 mg/dL (ref 0.2–1.2)
Total Protein: 6.7 g/dL (ref 6.1–8.1)
eGFR: 60 mL/min/{1.73_m2} (ref >=60)

## 2022-09-05 LAB — HEMOGLOBIN A1C
Hgb A1c MFr Bld: 5.6 %{Hb} (ref <5.7)
Mean Plasma Glucose: 114 mg/dL
eAG (mmol/L): 6.3 mmol/L

## 2022-09-05 LAB — TSH: TSH: 0.19 m[IU]/L — ABNORMAL LOW (ref 0.40–4.50)

## 2022-09-05 LAB — T4, FREE: Free T4: 1.4 ng/dL (ref 0.8–1.8)

## 2022-12-22 ENCOUNTER — Encounter: Payer: Self-pay | Admitting: Internal Medicine

## 2022-12-22 ENCOUNTER — Ambulatory Visit (INDEPENDENT_AMBULATORY_CARE_PROVIDER_SITE_OTHER): Payer: Medicare Other | Admitting: Internal Medicine

## 2022-12-22 VITALS — BP 110/78 | Ht 69.0 in | Wt 176.4 lb

## 2022-12-22 DIAGNOSIS — Z0001 Encounter for general adult medical examination with abnormal findings: Secondary | ICD-10-CM

## 2022-12-22 DIAGNOSIS — Z6826 Body mass index (BMI) 26.0-26.9, adult: Secondary | ICD-10-CM | POA: Diagnosis not present

## 2022-12-22 DIAGNOSIS — E663 Overweight: Secondary | ICD-10-CM | POA: Diagnosis not present

## 2022-12-22 NOTE — Progress Notes (Signed)
Subjective:    Patient ID: Bryce [name], male    DOB: Oct 21, 1955, 67 y.o.   MRN: [id number]  HPI  Patient presents to clinic today for his annual exam.    Flu: never Tetanus: 03/2020 COVID: x 4 Pneumovax: never Prevnar 20: 11/2021 Shingrix: x 2 at [facility] PSA screening: annually at [facility] Colon screening: 12/2021 Vision screening: annually Dentist: biannually  Diet: He does eat lean meat. He consumes fruits and veggies. He tries to avoid fried foods. He drinks mostly water, hot tea. Exercise: body weight exercise, treadmill.  Review of Systems     Past Medical History:  Diagnosis Date   Hyperlipidemia    Hypertension    Thyroid disease     Current Outpatient Medications  Medication Sig Dispense Refill   Cholecalciferol 25 MCG (1000 UT) tablet TAKE TWO TABLETS BY MOUTH ONCE EVERY DAY     levothyroxine (SYNTHROID) 50 MCG tablet Take 50 mcg by mouth daily before breakfast.     lisinopril (ZESTRIL) 40 MG tablet Take 40 mg by mouth daily.     simvastatin (ZOCOR) 80 MG tablet Take 1 tablet (80 mg total) by mouth daily. 90 tablet 0   No current facility-administered medications for this visit.    No Known Allergies  Family History  Problem Relation Age of Onset   Breast cancer Mother    Cancer Father        Unknown Cancer   Healthy Sister     Social History   Socioeconomic History   Marital status: Single    Spouse name: Not on file   Number of children: Not on file   Years of education: Not on file   Highest education level: Not on file  Occupational History   Occupation: Retired  Tobacco Use   Smoking status: Never   Smokeless tobacco: Never  Vaping Use   Vaping status: Never Used  Substance and Sexual Activity   Alcohol use: Never   Drug use: Never   Sexual activity: Not on file  Other Topics Concern   Not on file  Social History Narrative   Not on file   Social Determinants of Health   Financial Resource Strain: Low Risk  (01/02/2022)   Overall  Financial Resource Strain (CARDIA)    Difficulty of Paying Living Expenses: Not hard at all  Food Insecurity: No Food Insecurity (01/02/2022)   Hunger Vital Sign    Worried About Running Out of Food in the Last Year: Never true    Ran Out of Food in the Last Year: Never true  Transportation Needs: No Transportation Needs (01/02/2022)   PRAPARE - Administrator, Civil Service (Medical): No    Lack of Transportation (Non-Medical): No  Physical Activity: Sufficiently Active (01/02/2022)   Exercise Vital Sign    Days of Exercise per Week: 5 days    Minutes of Exercise per Session: 60 min  Stress: No Stress Concern Present (01/02/2022)   [employer] of Occupational Health - Occupational Stress Questionnaire    Feeling of Stress : Not at all  Social Connections: Moderately Integrated (01/02/2022)   Social Connection and Isolation Panel [NHANES]    Frequency of Communication with Friends and Family: More than three times a week    Frequency of Social Gatherings with Friends and Family: More than three times a week    Attends Religious Services: More than 4 times per year    Active Member of Clubs or Organizations: No    Attends  Club or Organization Meetings: More than 4 times per year    Marital Status: Divorced  Intimate Partner Violence: Not At Risk (01/02/2022)   Humiliation, Afraid, Rape, and Kick questionnaire    Fear of Current or Ex-Partner: No    Emotionally Abused: No    Physically Abused: No    Sexually Abused: No     Constitutional: Denies fever, malaise, fatigue, headache or abrupt weight changes.  HEENT: Denies eye pain, eye redness, ear pain, ringing in the ears, wax buildup, runny nose, nasal congestion, bloody nose, or sore throat. Respiratory: Denies difficulty breathing, shortness of breath, cough or sputum production.   Cardiovascular: Denies chest pain, chest tightness, palpitations or swelling in the hands or feet.  Gastrointestinal: Denies  abdominal pain, bloating, constipation, diarrhea or blood in the stool.  GU: Denies urgency, frequency, pain with urination, burning sensation, blood in urine, odor or discharge. Musculoskeletal: Denies decrease in range of motion, difficulty with gait, muscle pain or joint pain and swelling.  Skin: Denies redness, rashes, lesions or ulcercations.  Neurological: Denies dizziness, difficulty with memory, difficulty with speech or problems with balance and coordination.  Psych: Denies anxiety, depression, SI/HI.  No other specific complaints in a complete review of systems (except as listed in HPI above).  Objective:   Physical Exam   BP 110/78   Ht 5\' 9"  (1.753 m)   Wt 176 lb 6.4 oz (80 kg)   BMI 26.05 kg/m    Wt Readings from Last 3 Encounters:  09/04/22 168 lb (76.2 kg)  12/31/21 163 lb (73.9 kg)  12/30/21 157 lb 12.8 oz (71.6 kg)    General: Appears his stated age, overweight, in NAD. Skin: Warm, dry and intact.  HEENT: Head: normal shape and size; Eyes: sclera white, no icterus, conjunctiva pink, PERRLA and EOMs intact;  Neck:  Neck supple, trachea midline. No masses, lumps or thyromegaly present.  Cardiovascular: Normal rate and rhythm. S1,S2 noted.  No murmur, rubs or gallops noted. No JVD or BLE edema. No carotid bruits noted.   Pulmonary/Chest: Normal effort and positive vesicular breath sounds. No respiratory distress. No wheezes, rales or ronchi noted.  Abdomen: Soft and nontender. Normal bowel sounds.  Musculoskeletal: Strength 5/5 BUE/BLE. No difficulty with gait.  Neurological: Alert and oriented. Cranial nerves II-XII grossly intact. Coordination normal.  Psychiatric: Mood and affect normal. Behavior is normal. Judgment and thought content normal.          Assessment & Plan:   Preventative Health Maintenance:  He declines flu shots Tetanus UTD  Encouraged him to get his covid booster Prevnar 20 UTD, he will not need Pneumovax Shingrix UTD Colon  screening UTD Encouraged him to consume a balanced diet and exercise regimen Advised him to see an eye doctor and dentist annually Recent labs reviewed  RTC in 6 months, follow-up chronic conditions Nicki Reaper, NP

## 2022-12-22 NOTE — Patient Instructions (Signed)
Health Maintenance After Age 67 After age 67, you are at a higher risk for certain long-term diseases and infections as well as injuries from falls. Falls are a major cause of broken bones and head injuries in people who are older than age 67. Getting regular preventive care can help to keep you healthy and well. Preventive care includes getting regular testing and making lifestyle changes as recommended by your health care provider. Talk with your health care provider about: Which screenings and tests you should have. A screening is a test that checks for a disease when you have no symptoms. A diet and exercise plan that is right for you. What should I know about screenings and tests to prevent falls? Screening and testing are the best ways to find a health problem early. Early diagnosis and treatment give you the best chance of managing medical conditions that are common after age 67. Certain conditions and lifestyle choices may make you more likely to have a fall. Your health care provider may recommend: Regular vision checks. Poor vision and conditions such as cataracts can make you more likely to have a fall. If you wear glasses, make sure to get your prescription updated if your vision changes. Medicine review. Work with your health care provider to regularly review all of the medicines you are taking, including over-the-counter medicines. Ask your health care provider about any side effects that may make you more likely to have a fall. Tell your health care provider if any medicines that you take make you feel dizzy or sleepy. Strength and balance checks. Your health care provider may recommend certain tests to check your strength and balance while standing, walking, or changing positions. Foot health exam. Foot pain and numbness, as well as not wearing proper footwear, can make you more likely to have a fall. Screenings, including: Osteoporosis screening. Osteoporosis is a condition that causes  the bones to get weaker and break more easily. Blood pressure screening. Blood pressure changes and medicines to control blood pressure can make you feel dizzy. Depression screening. You may be more likely to have a fall if you have a fear of falling, feel depressed, or feel unable to do activities that you used to do. Alcohol use screening. Using too much alcohol can affect your balance and may make you more likely to have a fall. Follow these instructions at home: Lifestyle Do not drink alcohol if: Your health care provider tells you not to drink. If you drink alcohol: Limit how much you have to: 0-1 drink a day for women. 0-2 drinks a day for men. Know how much alcohol is in your drink. In the [place] one drink equals one 12 oz bottle of beer (355 mL), one 5 oz glass of wine (148 mL), or one 1 oz glass of hard liquor (44 mL). Do not use any products that contain nicotine or tobacco. These products include cigarettes, chewing tobacco, and vaping devices, such as e-cigarettes. If you need help quitting, ask your health care provider. Activity  Follow a regular exercise program to stay fit. This will help you maintain your balance. Ask your health care provider what types of exercise are appropriate for you. If you need a cane or walker, use it as recommended by your health care provider. Wear supportive shoes that have nonskid soles. Safety  Remove any tripping hazards, such as rugs, cords, and clutter. Install safety equipment such as grab bars in bathrooms and safety rails on stairs. Keep rooms and walkways   well-lit. General instructions Talk with your health care provider about your risks for falling. Tell your health care provider if: You fall. Be sure to tell your health care provider about all falls, even ones that seem minor. You feel dizzy, tiredness (fatigue), or off-balance. Take over-the-counter and prescription medicines only as told by your health care provider. These include  supplements. Eat a healthy diet and maintain a healthy weight. A healthy diet includes low-fat dairy products, low-fat (lean) meats, and fiber from whole grains, beans, and lots of fruits and vegetables. Stay current with your vaccines. Schedule regular health, dental, and eye exams. Summary Having a healthy lifestyle and getting preventive care can help to protect your health and wellness after age 67. Screening and testing are the best way to find a health problem early and help you avoid having a fall. Early diagnosis and treatment give you the best chance for managing medical conditions that are more common for people who are older than age 67. Falls are a major cause of broken bones and head injuries in people who are older than age 67. Take precautions to prevent a fall at home. Work with your health care provider to learn what changes you can make to improve your health and wellness and to prevent falls. This information is not intended to replace advice given to you by your health care provider. Make sure you discuss any questions you have with your health care provider. Document Revised: 05/28/2020 Document Reviewed: 05/28/2020 Elsevier Patient Education  2024 [employer]  

## 2022-12-22 NOTE — Assessment & Plan Note (Signed)
Encouraged diet and exercise for weight loss ?

## 2023-01-23 ENCOUNTER — Ambulatory Visit (INDEPENDENT_AMBULATORY_CARE_PROVIDER_SITE_OTHER): Payer: Medicare Other

## 2023-01-23 DIAGNOSIS — Z0000 Encounter for general adult medical examination without abnormal findings: Secondary | ICD-10-CM

## 2023-01-23 NOTE — Progress Notes (Signed)
 Subjective:   Bryce [name] is a 68 y.o. male who presents for Medicare Annual/Subsequent preventive examination.  Visit Complete: Virtual I connected with  Bryce [name] on 01/23/23 by a audio enabled telemedicine application and verified that I am speaking with the correct person using two identifiers.  This patient declined Interactive audio and acupuncturist. Therefore the visit was completed with audio only.   Patient Location: Home  Provider Location: Office/Clinic  I discussed the limitations of evaluation and management by telemedicine. The patient expressed understanding and agreed to proceed.  Vital Signs: Because this visit was a virtual/telehealth visit, some criteria may be missing or patient reported. Any vitals not documented were not able to be obtained and vitals that have been documented are patient reported.  Cardiac Risk Factors include: advanced age (>49men, >13 women);dyslipidemia;male gender;hypertension     Objective:    Today's Vitals   01/23/23 1008  PainSc: 0-No pain   There is no height or weight on file to calculate BMI.     01/23/2023   10:11 AM 12/30/2021    7:43 AM 03/30/2020   10:02 AM 03/20/2020    2:40 PM  Advanced Directives  Does Patient Have a Medical Advance Directive? No No No No  Would patient like information on creating a medical advance directive? No - Patient declined  No - Patient declined No - Patient declined    Current Medications (verified) Outpatient Encounter Medications as of 01/23/2023  Medication Sig   Cholecalciferol 25 MCG (1000 UT) tablet TAKE TWO TABLETS BY MOUTH ONCE EVERY DAY   levothyroxine (SYNTHROID) 50 MCG tablet Take 50 mcg by mouth daily before breakfast.   lisinopril (ZESTRIL) 40 MG tablet Take 40 mg by mouth daily.   simvastatin  (ZOCOR ) 80 MG tablet Take 1 tablet (80 mg total) by mouth daily.   No facility-administered encounter medications on file as of 01/23/2023.    Allergies  (verified) Patient has no known allergies.   History: Past Medical History:  Diagnosis Date   Hyperlipidemia    Hypertension    Thyroid  disease    Past Surgical History:  Procedure Laterality Date   COLONOSCOPY WITH PROPOFOL  N/A 12/30/2021   Procedure: COLONOSCOPY WITH PROPOFOL ;  Surgeon: Therisa Bi, MD;  Location: [facility] ENDOSCOPY;  Service: Gastroenterology;  Laterality: N/A;   Family History  Problem Relation Age of Onset   Breast cancer Mother    Cancer Father        Unknown Cancer   Healthy Sister    Social History   Socioeconomic History   Marital status: Single    Spouse name: Not on file   Number of children: Not on file   Years of education: Not on file   Highest education level: Not on file  Occupational History   Occupation: Retired  Tobacco Use   Smoking status: Never   Smokeless tobacco: Never  Vaping Use   Vaping status: Never Used  Substance and Sexual Activity   Alcohol use: Never   Drug use: Never   Sexual activity: Not on file  Other Topics Concern   Not on file  Social History Narrative   Not on file   Social Drivers of Health   Financial Resource Strain: Low Risk  (01/23/2023)   Overall Financial Resource Strain (CARDIA)    Difficulty of Paying Living Expenses: Not hard at all  Food Insecurity: No Food Insecurity (01/23/2023)   Hunger Vital Sign    Worried About Running Out of Food in the Last  Year: Never true    Ran Out of Food in the Last Year: Never true  Transportation Needs: No Transportation Needs (01/23/2023)   PRAPARE - Administrator, Civil Service (Medical): No    Lack of Transportation (Non-Medical): No  Physical Activity: Sufficiently Active (01/23/2023)   Exercise Vital Sign    Days of Exercise per Week: 4 days    Minutes of Exercise per Session: 50 min  Stress: No Stress Concern Present (01/23/2023)   [employer] of Occupational Health - Occupational Stress Questionnaire    Feeling of Stress : Not at all  Social  Connections: Socially Isolated (01/23/2023)   Social Connection and Isolation Panel [NHANES]    Frequency of Communication with Friends and Family: More than three times a week    Frequency of Social Gatherings with Friends and Family: More than three times a week    Attends Religious Services: Never    Database Administrator or Organizations: No    Attends Engineer, Structural: Never    Marital Status: Divorced    Tobacco Counseling Counseling given: Not Answered   Clinical Intake:  Pre-visit preparation completed: Yes  Pain : No/denies pain Pain Score: 0-No pain     BMI - recorded: 26 Nutritional Status: BMI 25 -29 Overweight Nutritional Risks: None Diabetes: No  How often do you need to have someone help you when you read instructions, pamphlets, or other written materials from your doctor or pharmacy?: 1 - Never  Interpreter Needed?: No  Information entered by :: Bryce DAS, LPN   Activities of Daily Living    01/23/2023   10:12 AM 09/04/2022    1:43 PM  In your present state of health, do you have any difficulty performing the following activities:  Hearing? 1 0  Vision? 0 0  Difficulty concentrating or making decisions? 0 0  Walking or climbing stairs? 0 0  Dressing or bathing? 0 0  Doing errands, shopping? 0 0  Preparing Food and eating ? N   Using the Toilet? N   In the past six months, have you accidently leaked urine? N   Do you have problems with loss of bowel control? N   Managing your Medications? N   Managing your Finances? N   Housekeeping or managing your Housekeeping? N     Patient Care Team: Antonette Angeline ORN, NP as PCP - General (Internal Medicine) [facility], [facility] (Optometry)  Indicate any recent Medical Services you may have received from other than [facility] providers in the past year (date may be approximate).     Assessment:   This is a routine wellness examination for [place].  Hearing/Vision screen Hearing Screening -  Comments:: WEARS AIDS, BOTH EARS Vision Screening - Comments:: READERS- [facility]   Goals Addressed             This Visit's Progress    DIET - EAT MORE FRUITS AND VEGETABLES         Depression Screen    01/23/2023   10:10 AM 12/22/2022   10:08 AM 09/04/2022    1:43 PM 01/02/2022    1:06 PM 12/17/2021   10:01 AM 02/07/2021    2:08 PM  PHQ 2/9 Scores  PHQ - 2 Score 0 0 0 0 0 0  PHQ- 9 Score 0     0    Fall Risk    01/23/2023   10:12 AM 12/22/2022   10:08 AM 09/04/2022    1:43  PM 01/02/2022    1:06 PM 12/17/2021   10:01 AM  Fall Risk   Falls in the past year? 0 0 0 0 0  Number falls in past yr: 0   0   Injury with Fall? 0  0 0 0  Risk for fall due to : No Fall Risks  No Fall Risks No Fall Risks   Follow up Falls prevention discussed;Falls evaluation completed   Falls evaluation completed     MEDICARE RISK AT HOME: Medicare Risk at Home Any stairs in or around the home?: Yes If so, are there any without handrails?: No Home free of loose throw rugs in walkways, pet beds, electrical cords, etc?: Yes Adequate lighting in your home to reduce risk of falls?: Yes Life alert?: No Use of a cane, walker or w/c?: No Grab bars in the bathroom?: No Shower chair or bench in shower?: No Elevated toilet seat or a handicapped toilet?: Yes  TIMED UP AND GO:  Was the test performed?  No    Cognitive Function:        01/23/2023   10:13 AM 01/02/2022    1:08 PM  6CIT Screen  What Year? 0 points 0 points  What month? 0 points 0 points  What time? 0 points 0 points  Count back from 20 0 points 0 points  Months in reverse 2 points 0 points  Repeat phrase 0 points 8 points  Total Score 2 points 8 points    Immunizations Immunization History  Administered Date(s) Administered   PFIZER(Purple Top)SARS-COV-2 Vaccination 04/05/2019, 04/26/2019, 11/17/2019   PNEUMOCOCCAL CONJUGATE-20 12/17/2021   Pfizer Covid-19 Vaccine Bivalent Booster 65yrs & up 11/19/2020   Tdap  02/28/2011, 03/20/2020   Tetanus 08/03/1996   Zoster Recombinant(Shingrix) 07/02/2019, 09/06/2019    TDAP status: Up to date  Flu Vaccine status: Declined, Education has been provided regarding the importance of this vaccine but patient still declined. Advised may receive this vaccine at local pharmacy or Health Dept. Aware to provide a copy of the vaccination record if obtained from local pharmacy or Health Dept. Verbalized acceptance and understanding.  Pneumococcal vaccine status: Up to date  Covid-19 vaccine status: Completed vaccines  Qualifies for Shingles Vaccine? Yes   Zostavax completed No   Shingrix Completed?: Yes  Screening Tests Health Maintenance  Topic Date Due   COVID-19 Vaccine (5 - 2024-25 season) 09/21/2022   INFLUENZA VACCINE  04/20/2023 (Originally 08/21/2022)   Medicare Annual Wellness (AWV)  01/23/2024   DTaP/Tdap/Td (3 - Td or Tdap) 03/21/2030   Colonoscopy  12/31/2031   Pneumonia Vaccine 47+ Years old  Completed   Hepatitis C Screening  Completed   Zoster Vaccines- Shingrix  Completed   HPV VACCINES  Aged Out    Health Maintenance  Health Maintenance Due  Topic Date Due   COVID-19 Vaccine (5 - 2024-25 season) 09/21/2022    Colorectal cancer screening: Type of screening: Colonoscopy. Completed 12/30/21. Repeat every 7 years  Lung Cancer Screening: (Low Dose CT Chest recommended if Age 60-80 years, 20 pack-year currently smoking OR have quit w/in 15years.) does not qualify.    Additional Screening:  Hepatitis C Screening: does qualify; Completed 12/17/21  Vision Screening: Recommended annual ophthalmology exams for early detection of glaucoma and other disorders of the eye. Is the patient up to date with their annual eye exam?  Yes  Who is the provider or what is the name of the office in which the patient attends annual eye exams? [facility] If  pt is not established with a provider, would they like to be referred to a provider to establish  care? No .   Dental Screening: Recommended annual dental exams for proper oral hygiene   Community Resource Referral / Chronic Care Management: CRR required this visit?  No   CCM required this visit?  No     Plan:     I have personally reviewed and noted the following in the patient's chart:   Medical and social history Use of alcohol, tobacco or illicit drugs  Current medications and supplements including opioid prescriptions. Patient is not currently taking opioid prescriptions. Functional ability and status Nutritional status Physical activity Advanced directives List of other physicians Hospitalizations, surgeries, and ER visits in previous 12 months Vitals Screenings to include cognitive, depression, and falls Referrals and appointments  In addition, I have reviewed and discussed with patient certain preventive protocols, quality metrics, and best practice recommendations. A written personalized care plan for preventive services as well as general preventive health recommendations were provided to patient.     Bryce GORMAN Das, LPN   08/25/7972   After Visit Summary: (MyChart) Due to this being a telephonic visit, the after visit summary with patients personalized plan was offered to patient via MyChart   Nurse Notes: NONE

## 2023-01-23 NOTE — Patient Instructions (Addendum)
 Bryce [name] , Thank you for taking time to come for your Medicare Wellness Visit. I appreciate your ongoing commitment to your health goals. Please review the following plan we discussed and let me know if I can assist you in the future.   Referrals/Orders/Follow-Ups/Clinician Recommendations: NONE  This is a list of the screening recommended for you and due dates:  Health Maintenance  Topic Date Due   COVID-19 Vaccine (5 - 2024-25 season) 09/21/2022   Flu Shot  04/20/2023*   Medicare Annual Wellness Visit  01/23/2024   DTaP/Tdap/Td vaccine (3 - Td or Tdap) 03/21/2030   Colon Cancer Screening  12/31/2031   Pneumonia Vaccine  Completed   Hepatitis C Screening  Completed   Zoster (Shingles) Vaccine  Completed   HPV Vaccine  Aged Out  *Topic was postponed. The date shown is not the original due date.    Advanced directives: (ACP Link)Information on Advanced Care Planning can be found at [place]  Secretary of [facility] Advance Health Care Directives Advance Health Care Directives ([email or web address]   Next Medicare Annual Wellness Visit scheduled for next year: Yes   01/29/24 @ 10:10 AM BY VIDEO

## 2023-06-22 ENCOUNTER — Ambulatory Visit (INDEPENDENT_AMBULATORY_CARE_PROVIDER_SITE_OTHER): Payer: Self-pay | Admitting: Internal Medicine

## 2023-06-22 ENCOUNTER — Encounter: Payer: Self-pay | Admitting: Internal Medicine

## 2023-06-22 VITALS — BP 110/74 | Ht 69.0 in | Wt 173.2 lb

## 2023-06-22 DIAGNOSIS — E7800 Pure hypercholesterolemia, unspecified: Secondary | ICD-10-CM

## 2023-06-22 DIAGNOSIS — E039 Hypothyroidism, unspecified: Secondary | ICD-10-CM | POA: Diagnosis not present

## 2023-06-22 DIAGNOSIS — I10 Essential (primary) hypertension: Secondary | ICD-10-CM

## 2023-06-22 DIAGNOSIS — R739 Hyperglycemia, unspecified: Secondary | ICD-10-CM | POA: Diagnosis not present

## 2023-06-22 DIAGNOSIS — Z6825 Body mass index (BMI) 25.0-25.9, adult: Secondary | ICD-10-CM

## 2023-06-22 DIAGNOSIS — E663 Overweight: Secondary | ICD-10-CM

## 2023-06-22 DIAGNOSIS — M25511 Pain in right shoulder: Secondary | ICD-10-CM

## 2023-06-22 DIAGNOSIS — D709 Neutropenia, unspecified: Secondary | ICD-10-CM

## 2023-06-22 NOTE — Assessment & Plan Note (Signed)
 CBC today.

## 2023-06-22 NOTE — Assessment & Plan Note (Signed)
C-Met and lipid profile today Encouraged low-fat diet Continue simvastatin

## 2023-06-22 NOTE — Patient Instructions (Signed)
 Hypertension, Adult Hypertension is another name for high blood pressure. High blood pressure forces your heart to work harder to pump blood. This can cause problems over time. There are two numbers in a blood pressure reading. There is a top number (systolic) over a bottom number (diastolic). It is best to have a blood pressure that is below 120/80. What are the causes? The cause of this condition is not known. Some other conditions can lead to high blood pressure. What increases the risk? Some lifestyle factors can make you more likely to develop high blood pressure: Smoking. Not getting enough exercise or physical activity. Being overweight. Having too much fat, sugar, calories, or salt (sodium) in your diet. Drinking too much alcohol. Other risk factors include: Having any of these conditions: Heart disease. Diabetes. High cholesterol. Kidney disease. Obstructive sleep apnea. Having a family history of high blood pressure and high cholesterol. Age. The risk increases with age. Stress. What are the signs or symptoms? High blood pressure may not cause symptoms. Very high blood pressure (hypertensive crisis) may cause: Headache. Fast or uneven heartbeats (palpitations). Shortness of breath. Nosebleed. Vomiting or feeling like you may vomit (nauseous). Changes in how you see. Very bad chest pain. Feeling dizzy. Seizures. How is this treated? This condition is treated by making healthy lifestyle changes, such as: Eating healthy foods. Exercising more. Drinking less alcohol. Your doctor may prescribe medicine if lifestyle changes do not help enough and if: Your top number is above 130. Your bottom number is above 80. Your personal target blood pressure may vary. Follow these instructions at home: Eating and drinking  If told, follow the DASH eating plan. To follow this plan: Fill one half of your plate at each meal with fruits and vegetables. Fill one fourth of your plate  at each meal with whole grains. Whole grains include whole-wheat pasta, brown rice, and whole-grain bread. Eat or drink low-fat dairy products, such as skim milk or low-fat yogurt. Fill one fourth of your plate at each meal with low-fat (lean) proteins. Low-fat proteins include fish, chicken without skin, eggs, beans, and tofu. Avoid fatty meat, cured and processed meat, or chicken with skin. Avoid pre-made or processed food. Limit the amount of salt in your diet to less than 1,500 mg each day. Do not drink alcohol if: Your doctor tells you not to drink. You are pregnant, may be pregnant, or are planning to become pregnant. If you drink alcohol: Limit how much you have to: 0-1 drink a day for women. 0-2 drinks a day for men. Know how much alcohol is in your drink. In the [place] one drink equals one 12 oz bottle of beer (355 mL), one 5 oz glass of wine (148 mL), or one 1 oz glass of hard liquor (44 mL). Lifestyle  Work with your doctor to stay at a healthy weight or to lose weight. Ask your doctor what the best weight is for you. Get at least 30 minutes of exercise that causes your heart to beat faster (aerobic exercise) most days of the week. This may include walking, swimming, or biking. Get at least 30 minutes of exercise that strengthens your muscles (resistance exercise) at least 3 days a week. This may include lifting weights or doing Pilates. Do not smoke or use any products that contain nicotine or tobacco. If you need help quitting, ask your doctor. Check your blood pressure at home as told by your doctor. Keep all follow-up visits. Medicines Take over-the-counter and prescription medicines  only as told by your doctor. Follow directions carefully. Do not skip doses of blood pressure medicine. The medicine does not work as well if you skip doses. Skipping doses also puts you at risk for problems. Ask your doctor about side effects or reactions to medicines that you should watch  for. Contact a doctor if: You think you are having a reaction to the medicine you are taking. You have headaches that keep coming back. You feel dizzy. You have swelling in your ankles. You have trouble with your vision. Get help right away if: You get a very bad headache. You start to feel mixed up (confused). You feel weak or numb. You feel faint. You have very bad pain in your: Chest. Belly (abdomen). You vomit more than once. You have trouble breathing. These symptoms may be an emergency. Get help right away. Call 911. Do not wait to see if the symptoms will go away. Do not drive yourself to the hospital. Summary Hypertension is another name for high blood pressure. High blood pressure forces your heart to work harder to pump blood. For most people, a normal blood pressure is less than 120/80. Making healthy choices can help lower blood pressure. If your blood pressure does not get lower with healthy choices, you may need to take medicine. This information is not intended to replace advice given to you by your health care provider. Make sure you discuss any questions you have with your health care provider. Document Revised: 10/25/2020 Document Reviewed: 10/25/2020 Elsevier Patient Education  2024 [employer].

## 2023-06-22 NOTE — Assessment & Plan Note (Signed)
 Encouraged diet and exercise for weight loss ?

## 2023-06-22 NOTE — Assessment & Plan Note (Signed)
 TSH and free T4 today We will adjust levothyroxine if needed based on labs

## 2023-06-22 NOTE — Assessment & Plan Note (Signed)
Controlled on lisinopril C-Met today

## 2023-06-22 NOTE — Progress Notes (Signed)
 Subjective:    Patient ID: Bryce [name], male    DOB: October 10, 1955, 68 y.o.   MRN: [id number]  HPI  Patient presents to clinic today for 6 month follow-up of chronic conditions.  Hypothyroidism: He denies any issues on his current dose of levothyroxine.  He does not follow with endocrinology.  HTN: His BP today is 110/74.  He is taking lisinopril as prescribed.  There is no ECG on file.  HLD: His last LDL was 86, triglycerides 409, 12/2022.  He denies myalgias on simvastatin .  He tries to consume a low-fat diet.  Neutropenia: His last WBC count was 3.2, 12/2022.  He does not follow with hematology.  He also reports right shoulder pain. He reports this started 2 weeks ago.  He he reports that he hit his arm on a door frame and thinks that it strained his shoulder.  He has tried tylenol and been using a sling with some relief of symptoms.   Review of Systems     Past Medical History:  Diagnosis Date   Hyperlipidemia    Hypertension    Thyroid  disease     Current Outpatient Medications  Medication Sig Dispense Refill   Cholecalciferol 25 MCG (1000 UT) tablet TAKE TWO TABLETS BY MOUTH ONCE EVERY DAY     levothyroxine (SYNTHROID) 50 MCG tablet Take 50 mcg by mouth daily before breakfast.     lisinopril (ZESTRIL) 40 MG tablet Take 40 mg by mouth daily.     simvastatin  (ZOCOR ) 80 MG tablet Take 1 tablet (80 mg total) by mouth daily. 90 tablet 0   No current facility-administered medications for this visit.    No Known Allergies  Family History  Problem Relation Age of Onset   Breast cancer Mother    Cancer Father        Unknown Cancer   Healthy Sister     Social History   Socioeconomic History   Marital status: Single    Spouse name: Not on file   Number of children: Not on file   Years of education: Not on file   Highest education level: Not on file  Occupational History   Occupation: Retired  Tobacco Use   Smoking status: Never   Smokeless tobacco: Never   Vaping Use   Vaping status: Never Used  Substance and Sexual Activity   Alcohol use: Never   Drug use: Never   Sexual activity: Not on file  Other Topics Concern   Not on file  Social History Narrative   Not on file   Social Drivers of Health   Financial Resource Strain: Low Risk  (01/23/2023)   Overall Financial Resource Strain (CARDIA)    Difficulty of Paying Living Expenses: Not hard at all  Food Insecurity: No Food Insecurity (01/23/2023)   Hunger Vital Sign    Worried About Running Out of Food in the Last Year: Never true    Ran Out of Food in the Last Year: Never true  Transportation Needs: No Transportation Needs (01/23/2023)   PRAPARE - Administrator, Civil Service (Medical): No    Lack of Transportation (Non-Medical): No  Physical Activity: Sufficiently Active (01/23/2023)   Exercise Vital Sign    Days of Exercise per Week: 4 days    Minutes of Exercise per Session: 50 min  Stress: No Stress Concern Present (01/23/2023)   [employer] of Occupational Health - Occupational Stress Questionnaire    Feeling of Stress : Not at all  Social  Connections: Socially Isolated (01/23/2023)   Social Connection and Isolation Panel [NHANES]    Frequency of Communication with Friends and Family: More than three times a week    Frequency of Social Gatherings with Friends and Family: More than three times a week    Attends Religious Services: Never    Database administrator or Organizations: No    Attends Banker Meetings: Never    Marital Status: Divorced  Catering manager Violence: Not At Risk (01/23/2023)   Humiliation, Afraid, Rape, and Kick questionnaire    Fear of Current or Ex-Partner: No    Emotionally Abused: No    Physically Abused: No    Sexually Abused: No     Constitutional: Denies fever, malaise, fatigue, headache or abrupt weight changes.  HEENT: Denies eye pain, eye redness, ear pain, ringing in the ears, wax buildup, runny nose, nasal  congestion, bloody nose, or sore throat. Respiratory: Denies difficulty breathing, shortness of breath, cough or sputum production.   Cardiovascular: Denies chest pain, chest tightness, palpitations or swelling in the hands or feet.  Gastrointestinal: Denies abdominal pain, bloating, constipation, diarrhea or blood in the stool.  GU: Denies urgency, frequency, pain with urination, burning sensation, blood in urine, odor or discharge. Musculoskeletal: Patient reports right toe pain.  Denies decrease in range of motion, difficulty with gait, muscle pain or joint swelling.  Skin: Denies redness, rashes, lesions or ulcercations.  Neurological: Denies dizziness, difficulty with memory, difficulty with speech or problems with balance and coordination.  Psych: Denies anxiety, depression, SI/HI.  No other specific complaints in a complete review of systems (except as listed in HPI above).  Objective:   Physical Exam  BP 110/74 (BP Location: Right Arm, Patient Position: Sitting, Cuff Size: Normal)   Ht 5\' 9"  (1.753 m)   Wt 173 lb 3.2 oz (78.6 kg)   BMI 25.58 kg/m    Wt Readings from Last 3 Encounters:  12/22/22 176 lb 6.4 oz (80 kg)  09/04/22 168 lb (76.2 kg)  12/31/21 163 lb (73.9 kg)    General: Appears his stated age, well developed, well nourished in NAD. Skin: Warm, dry and intact.  HEENT: Head: normal shape and size; Eyes: sclera white, no icterus, conjunctiva pink, PERRLA and EOMs intact, exophthalmos noted;  Cardiovascular: Bradycardic with normal rhythm. S1,S2 noted.  No murmur, rubs or gallops noted. No JVD or BLE edema. No carotid bruits noted. Pulmonary/Chest: Normal effort and positive vesicular breath sounds. No respiratory distress. No wheezes, rales or ronchi noted.  Musculoskeletal: Normal internal and external rotation of the right shoulder.  Negative drop can test on the right.  Pain with palpation of the anterior proximal biceps tendon.  Strength 5/5 BUE.  Handgrips equal.   No difficulty with gait.  Neurological: Alert and oriented.  Coordination normal.  Psychiatric: Mood and affect normal. Behavior is normal. Judgment and thought content normal.     BMET    Component Value Date/Time   NA 138 09/04/2022 1400   K 4.5 09/04/2022 1400   CL 105 09/04/2022 1400   CO2 25 09/04/2022 1400   GLUCOSE 89 09/04/2022 1400   BUN 27 (H) 09/04/2022 1400   CREATININE 1.31 09/04/2022 1400   CALCIUM  10.3 09/04/2022 1400    Lipid Panel     Component Value Date/Time   CHOL 172 09/04/2022 1400   TRIG 162 (H) 09/04/2022 1400   HDL 60 09/04/2022 1400   CHOLHDL 2.9 09/04/2022 1400   LDLCALC 86 09/04/2022 1400  CBC    Component Value Date/Time   WBC 3.2 (L) 09/04/2022 1400   RBC 5.29 09/04/2022 1400   HGB 15.1 09/04/2022 1400   HCT 45.1 09/04/2022 1400   PLT 199 09/04/2022 1400   MCV 85.3 09/04/2022 1400   MCH 28.5 09/04/2022 1400   MCHC 33.5 09/04/2022 1400   RDW 12.7 09/04/2022 1400    Hgb A1C Lab Results  Component Value Date   HGBA1C 5.6 09/04/2022          Assessment & Plan:   Acute right shoulder pain:  Likely biceps tendinitis Recommend he try ibuprofen instead of Tylenol Recommend ice for 10 minutes twice daily Avoid overuse  RTC in 6 months for your annual exam Helayne Lo, NP

## 2023-06-23 ENCOUNTER — Ambulatory Visit: Payer: Self-pay | Admitting: Internal Medicine

## 2023-07-02 ENCOUNTER — Telehealth: Payer: Self-pay

## 2023-07-02 DIAGNOSIS — D729 Disorder of white blood cells, unspecified: Secondary | ICD-10-CM

## 2023-07-02 NOTE — Telephone Encounter (Signed)
 Copied from CRM [id number]. Topic: Referral - Request for Referral >> Jul 02, 2023  2:23 PM Felizardo Hotter wrote: Did the patient discuss referral with their provider in the last year? Yes (If No - schedule appointment) (If Yes - send message)  Appointment offered? Yes  Type of order/referral and detailed reason for visit: Referral to a Hematologist for further evaluation of this low white blood cell count.  Preference of office, provider, location: [place], [place] area  If referral order, have you been seen by this specialty before? No (If Yes, this issue or another issue? When? Where?  Can we respond through MyChart? No

## 2023-07-02 NOTE — Addendum Note (Signed)
 Addended by: Carollynn Cirri on: 07/02/2023 03:33 PM   Modules accepted: Orders

## 2023-07-02 NOTE — Telephone Encounter (Signed)
 Copied from CRM [id number]. Topic: Clinical - Lab/Test Results >> Jul 02, 2023  2:29 PM Felizardo Hotter wrote: Reason for CRM: Pt called for lab results which were provided. Pt did agree to go to referral to a hematologist for further evaluation of this low white blood cell count. Referral was requested. Pt did not have any questions.

## 2023-07-02 NOTE — Telephone Encounter (Signed)
Referral to hematology placed.

## 2023-07-08 ENCOUNTER — Encounter: Payer: Self-pay | Admitting: Oncology

## 2023-07-08 ENCOUNTER — Inpatient Hospital Stay

## 2023-07-08 ENCOUNTER — Inpatient Hospital Stay: Attending: Oncology | Admitting: Oncology

## 2023-07-08 VITALS — BP 150/89 | HR 80 | Temp 98.0°F | Resp 16 | Ht 69.0 in | Wt 170.8 lb

## 2023-07-08 DIAGNOSIS — D709 Neutropenia, unspecified: Secondary | ICD-10-CM

## 2023-07-08 DIAGNOSIS — D72819 Decreased white blood cell count, unspecified: Secondary | ICD-10-CM | POA: Diagnosis not present

## 2023-07-08 DIAGNOSIS — Z79899 Other long term (current) drug therapy: Secondary | ICD-10-CM | POA: Diagnosis not present

## 2023-07-08 DIAGNOSIS — I10 Essential (primary) hypertension: Secondary | ICD-10-CM | POA: Insufficient documentation

## 2023-07-08 LAB — CBC (CANCER CENTER ONLY)
HCT: 44.5 % (ref 39.0–52.0)
Hemoglobin: 15.1 g/dL (ref 13.0–17.0)
MCH: 28.4 pg (ref 26.0–34.0)
MCHC: 33.9 g/dL (ref 30.0–36.0)
MCV: 83.6 fL (ref 80.0–100.0)
Platelet Count: 165 10*3/uL (ref 150–400)
RBC: 5.32 MIL/uL (ref 4.22–5.81)
RDW: 13.2 % (ref 11.5–15.5)
WBC Count: 3.5 10*3/uL — ABNORMAL LOW (ref 4.0–10.5)
nRBC: 0 % (ref 0.0–0.2)

## 2023-07-08 LAB — IRON AND TIBC
Iron: 111 ug/dL (ref 45–182)
Saturation Ratios: 31 % (ref 17.9–39.5)
TIBC: 354 ug/dL (ref 250–450)
UIBC: 243 ug/dL

## 2023-07-08 LAB — VITAMIN B12: Vitamin B-12: 253 pg/mL (ref 180–914)

## 2023-07-08 LAB — FOLATE: Folate: 17.8 ng/mL (ref >5.9)

## 2023-07-08 LAB — FERRITIN: Ferritin: 99 ng/mL (ref 24–336)

## 2023-07-08 NOTE — Progress Notes (Signed)
 [facility]  [phone] [phone]) [phone]  ID: Bryce [name] OB: [birth year]  MR#: [id number]  QIO#:[id number]  Patient Care Team: Carollynn Cirri, NP as PCP - General (Internal Medicine) Pa, [facility] (Optometry)  CHIEF COMPLAINT: Leukopenia.  INTERVAL HISTORY: Patient is a 68 year old male who noticed to have a decreased white blood cell count on routine blood work.  Repeat testing confirmed the results.  Currently feels well and is asymptomatic.  He has no new medications.  He denies any recent fevers or illnesses.  He has a good appetite and denies weight loss.  He has no neurologic complaints.  He denies any chest pain, shortness of breath, cough, or hemoptysis.  He denies any nausea, vomiting, constipation, or diarrhea.  He has no urinary complaints.  Patient offers no specific complaints  REVIEW OF SYSTEMS:   Review of Systems  Constitutional: Negative.  Negative for fever, malaise/fatigue and weight loss.  Respiratory: Negative.  Negative for cough, hemoptysis and shortness of breath.   Cardiovascular: Negative.  Negative for chest pain and leg swelling.  Gastrointestinal: Negative.  Negative for abdominal pain.  Genitourinary: Negative.  Negative for dysuria.  Musculoskeletal: Negative.  Negative for back pain.  Skin: Negative.  Negative for rash.  Neurological: Negative.  Negative for dizziness, focal weakness, weakness and headaches.  Psychiatric/Behavioral: Negative.  The patient is not nervous/anxious.     As per HPI. Otherwise, a complete review of systems is negative.  PAST MEDICAL HISTORY: Past Medical History:  Diagnosis Date   Hyperlipidemia    Hypertension    Thyroid  disease     PAST SURGICAL HISTORY: Past Surgical History:  Procedure Laterality Date   COLONOSCOPY WITH PROPOFOL  N/A 12/30/2021   Procedure: COLONOSCOPY WITH PROPOFOL ;  Surgeon: Luke Salaam, MD;  Location: [facility] ENDOSCOPY;  Service: Gastroenterology;   Laterality: N/A;    FAMILY HISTORY: Family History  Problem Relation Age of Onset   Breast cancer Mother    Cancer Father        Unknown Cancer   Healthy Sister     ADVANCED DIRECTIVES (Y/N):  N  HEALTH MAINTENANCE: Social History   Tobacco Use   Smoking status: Never   Smokeless tobacco: Never  Vaping Use   Vaping status: Never Used  Substance Use Topics   Alcohol use: Never   Drug use: Never     Colonoscopy:  PAP:  Bone density:  Lipid panel:  No Known Allergies  Current Outpatient Medications  Medication Sig Dispense Refill   cetirizine (ZYRTEC) 5 MG tablet Take 5 mg by mouth.     Cholecalciferol 25 MCG (1000 UT) tablet TAKE TWO TABLETS BY MOUTH ONCE EVERY DAY     fluticasone (FLONASE) 50 MCG/ACT nasal spray Place 2 sprays into both nostrils daily.     levothyroxine (SYNTHROID) 50 MCG tablet Take 50 mcg by mouth daily before breakfast.     lisinopril (ZESTRIL) 40 MG tablet Take 40 mg by mouth daily.     simvastatin  (ZOCOR ) 80 MG tablet Take 1 tablet (80 mg total) by mouth daily. 90 tablet 0   No current facility-administered medications for this visit.    OBJECTIVE: Vitals:   07/08/23 1111  BP: (!) 150/89  Pulse: 80  Resp: 16  Temp: 98 F (36.7 C)  SpO2: 100%     Body mass index is 25.22 kg/m.    ECOG FS:0 - Asymptomatic  General: Well-developed, well-nourished, no acute distress. Eyes: Pink conjunctiva, anicteric sclera. HEENT: Normocephalic, moist mucous membranes.  Lungs: No audible wheezing or coughing. Heart: Regular rate and rhythm. Abdomen: Soft, nontender, no obvious distention. Musculoskeletal: No edema, cyanosis, or clubbing. Neuro: Alert, answering all questions appropriately. Cranial nerves grossly intact. Skin: No rashes or petechiae noted. Psych: Normal affect. Lymphatics: No cervical, calvicular, axillary or inguinal LAD.   LAB RESULTS:  Lab Results  Component Value Date   NA 140 06/22/2023   K 4.7 06/22/2023   CL 106  06/22/2023   CO2 29 06/22/2023   GLUCOSE 93 06/22/2023   BUN 30 (H) 06/22/2023   CREATININE 1.36 (H) 06/22/2023   CALCIUM  10.5 (H) 06/22/2023   PROT 6.6 06/22/2023   AST 31 06/22/2023   ALT 36 06/22/2023   BILITOT 0.5 06/22/2023    Lab Results  Component Value Date   WBC 3.5 (L) 07/08/2023   HGB 15.1 07/08/2023   HCT 44.5 07/08/2023   MCV 83.6 07/08/2023   PLT 165 07/08/2023     STUDIES: No results found.  ASSESSMENT: Leukopenia.  PLAN:    Leukopenia: Patient's total white blood cell count remains decreased, but stable at 3.5.  White count has ranged between 2.7 and 3.5 since August 2024.  All of his other laboratory work from today is pending at time of dictation.  No intervention is needed.  Patient does not require bone marrow biopsy.  Return to clinic in 3 weeks with video-assisted telemedicine visit for further evaluation and discussion of his laboratory results.  I spent a total of 45 minutes reviewing chart data, face-to-face evaluation with the patient, counseling and coordination of care as detailed above.   Patient expressed understanding and was in agreement with this plan. He also understands that He can call clinic at any time with any questions, concerns, or complaints.    Shellie Dials, MD   07/08/2023 12:14 PM

## 2023-07-09 LAB — PROTEIN ELECTROPHORESIS, SERUM
A/G Ratio: 1.4 (ref 0.7–1.7)
Albumin ELP: 4.1 g/dL (ref 2.9–4.4)
Alpha-1-Globulin: 0.2 g/dL (ref 0.0–0.4)
Alpha-2-Globulin: 0.6 g/dL (ref 0.4–1.0)
Beta Globulin: 1.1 g/dL (ref 0.7–1.3)
Gamma Globulin: 1.2 g/dL (ref 0.4–1.8)
Globulin, Total: 2.9 g/dL (ref 2.2–3.9)
Total Protein ELP: 7 g/dL (ref 6.0–8.5)

## 2023-07-29 ENCOUNTER — Inpatient Hospital Stay: Attending: Oncology | Admitting: Oncology

## 2023-07-29 DIAGNOSIS — D709 Neutropenia, unspecified: Secondary | ICD-10-CM | POA: Diagnosis not present

## 2023-07-29 NOTE — Progress Notes (Signed)
 [facility]  [phone] [phone]  ID: Bryce [name] OB: 11-Dec-1955  MR#: [id number]  RDW#:[id number]  Patient Care Team: Antonette Angeline ORN, NP as PCP - General (Internal Medicine) Pa, [facility], [place])  I connected with Bryce [name] on 07/29/23 at 10:45 AM EDT by video enabled telemedicine visit and verified that I am speaking with the correct person using two identifiers.   I discussed the limitations, risks, security and privacy concerns of performing an evaluation and management service by telemedicine and the availability of in-person appointments. I also discussed with the patient that there may be a patient responsible charge related to this service. The patient expressed understanding and agreed to proceed.   Other persons participating in the visit and their role in the encounter: Patient, MD.  Patient's location: Home. Provider's location: Clinic.  CHIEF COMPLAINT: Leukopenia.  INTERVAL HISTORY: Patient agreed to video-assisted telemedicine visit for further evaluation and discussion of his laboratory results.  He continues to feel well and asymptomatic.  He has no neurologic complaints.  He denies any recent fevers or illnesses.  He has a good appetite and denies weight loss. He denies any chest pain, shortness of breath, cough, or hemoptysis.  He denies any nausea, vomiting, constipation, or diarrhea.  He has no urinary complaints.  Patient offers no specific complaints today.  REVIEW OF SYSTEMS:   Review of Systems  Constitutional: Negative.  Negative for fever, malaise/fatigue and weight loss.  Respiratory: Negative.  Negative for cough, hemoptysis and shortness of breath.   Cardiovascular: Negative.  Negative for chest pain and leg swelling.  Gastrointestinal: Negative.  Negative for abdominal pain.  Genitourinary: Negative.  Negative for dysuria.  Musculoskeletal: Negative.  Negative for back pain.  Skin: Negative.   Negative for rash.  Neurological: Negative.  Negative for dizziness, focal weakness, weakness and headaches.  Psychiatric/Behavioral: Negative.  The patient is not nervous/anxious.     As per HPI. Otherwise, a complete review of systems is negative.  PAST MEDICAL HISTORY: Past Medical History:  Diagnosis Date   Hyperlipidemia    Hypertension    Thyroid  disease     PAST SURGICAL HISTORY: Past Surgical History:  Procedure Laterality Date   COLONOSCOPY WITH PROPOFOL  N/A 12/30/2021   Procedure: COLONOSCOPY WITH PROPOFOL ;  Surgeon: Therisa Bi, MD;  Location: [facility] ENDOSCOPY;  Service: Gastroenterology;  Laterality: N/A;    FAMILY HISTORY: Family History  Problem Relation Age of Onset   Breast cancer Mother    Cancer Father        Unknown Cancer   Healthy Sister     ADVANCED DIRECTIVES (Y/N):  N  HEALTH MAINTENANCE: Social History   Tobacco Use   Smoking status: Never   Smokeless tobacco: Never  Vaping Use   Vaping status: Never Used  Substance Use Topics   Alcohol use: Never   Drug use: Never     Colonoscopy:  PAP:  Bone density:  Lipid panel:  No Known Allergies  Current Outpatient Medications  Medication Sig Dispense Refill   cetirizine (ZYRTEC) 5 MG tablet Take 5 mg by mouth.     Cholecalciferol 25 MCG (1000 UT) tablet TAKE TWO TABLETS BY MOUTH ONCE EVERY DAY     fluticasone (FLONASE) 50 MCG/ACT nasal spray Place 2 sprays into both nostrils daily.     levothyroxine (SYNTHROID) 50 MCG tablet Take 50 mcg by mouth daily before breakfast.     lisinopril (ZESTRIL) 40 MG tablet Take 40 mg by mouth daily.  simvastatin  (ZOCOR ) 80 MG tablet Take 1 tablet (80 mg total) by mouth daily. 90 tablet 0   No current facility-administered medications for this visit.    OBJECTIVE: There were no vitals filed for this visit.    There is no height or weight on file to calculate BMI.    ECOG FS:0 - Asymptomatic  General: Well-developed, well-nourished, no acute  distress. HEENT: Normocephalic. Neuro: Alert, answering all questions appropriately. Cranial nerves grossly intact. Psych: Normal affect.  LAB RESULTS:  Lab Results  Component Value Date   NA 140 06/22/2023   K 4.7 06/22/2023   CL 106 06/22/2023   CO2 29 06/22/2023   GLUCOSE 93 06/22/2023   BUN 30 (H) 06/22/2023   CREATININE 1.36 (H) 06/22/2023   CALCIUM  10.5 (H) 06/22/2023   PROT 6.6 06/22/2023   AST 31 06/22/2023   ALT 36 06/22/2023   BILITOT 0.5 06/22/2023    Lab Results  Component Value Date   WBC 3.5 (L) 07/08/2023   HGB 15.1 07/08/2023   HCT 44.5 07/08/2023   MCV 83.6 07/08/2023   PLT 165 07/08/2023     STUDIES: No results found.  ASSESSMENT: Leukopenia.  PLAN:    Leukopenia: Chronic and unchanged.  Patient's total white blood cell count remains decreased, but stable at 3.5.  White count has ranged between 2.7 and 3.5 since August 2024.  All of his other laboratory work from today is either negative or within normal limits.  No intervention is needed.  Patient does not require bone marrow biopsy.  After discussion with the patient, it was agreed upon that no further follow-up is necessary.  Please refer patient back if there are any questions or concerns.  I provided 20 minutes of face-to-face video visit time during this encounter which included chart review, counseling, and coordination of care as documented above.   Patient expressed understanding and was in agreement with this plan. He also understands that He can call clinic at any time with any questions, concerns, or complaints.    Evalene JINNY Reusing, MD   07/29/2023 11:43 AM

## 2023-08-21 LAB — COMP PANEL: LEUKEMIA/LYMPHOMA

## 2023-08-21 LAB — NEUTROPHIL AB TEST LEVEL 1: NEUTROPHIL SCR/PANEL RESULT: NEGATIVE

## 2023-08-21 LAB — INTELLIGEN MYELOID

## 2023-08-24 ENCOUNTER — Encounter: Payer: Self-pay | Admitting: Oncology

## 2023-11-24 LAB — COMPREHENSIVE METABOLIC PANEL WITH GFR
AG Ratio: 1.9 (calc) (ref 1.0–2.5)
ALT: 36 U/L (ref 9–46)
AST: 31 U/L (ref 10–35)
Albumin: 4.3 g/dL (ref 3.6–5.1)
Alkaline phosphatase (APISO): 44 U/L (ref 35–144)
BUN/Creatinine Ratio: 22 (calc) (ref 6–22)
BUN: 30 mg/dL — ABNORMAL HIGH (ref 7–25)
CO2: 29 mmol/L (ref 20–32)
Calcium: 10.5 mg/dL — ABNORMAL HIGH (ref 8.6–10.3)
Chloride: 106 mmol/L (ref 98–110)
Creat: 1.36 mg/dL — ABNORMAL HIGH (ref 0.70–1.35)
Globulin: 2.3 g/dL (ref 1.9–3.7)
Glucose, Bld: 93 mg/dL (ref 65–139)
Potassium: 4.7 mmol/L (ref 3.5–5.3)
Sodium: 140 mmol/L (ref 135–146)
Total Bilirubin: 0.5 mg/dL (ref 0.2–1.2)
Total Protein: 6.6 g/dL (ref 6.1–8.1)
eGFR: 57 mL/min/1.73m2 — ABNORMAL LOW (ref >=60)

## 2023-11-24 LAB — T4, FREE: Free T4: 1.2 ng/dL (ref 0.8–1.8)

## 2023-11-24 LAB — LIPID PANEL
Cholesterol: 164 mg/dL (ref <200)
HDL: 61 mg/dL (ref >=40)
LDL Cholesterol (Calc): 86 mg/dL
Non-HDL Cholesterol (Calc): 103 mg/dL (ref <130)
Total CHOL/HDL Ratio: 2.7 (calc) (ref <5.0)
Triglycerides: 80 mg/dL (ref <150)

## 2023-11-24 LAB — CBC
HCT: 44.8 % (ref 38.5–50.0)
Hemoglobin: 14.3 g/dL (ref 13.2–17.1)
MCH: 28.1 pg (ref 27.0–33.0)
MCHC: 31.9 g/dL — ABNORMAL LOW (ref 32.0–36.0)
MCV: 88.2 fL (ref 80.0–100.0)
MPV: 10.4 fL (ref 7.5–12.5)
Platelets: 162 Thousand/uL (ref 140–400)
RBC: 5.08 Million/uL (ref 4.20–5.80)
RDW: 12.9 % (ref 11.0–15.0)
WBC: 2.7 Thousand/uL — ABNORMAL LOW (ref 3.8–10.8)

## 2023-11-24 LAB — HEMOGLOBIN A1C
Hgb A1c MFr Bld: 5.6 % (ref <5.7)
Mean Plasma Glucose: 114 mg/dL
eAG (mmol/L): 6.3 mmol/L

## 2023-11-24 LAB — TSH: TSH: 4.18 m[IU]/L (ref 0.40–4.50)

## 2023-12-24 ENCOUNTER — Ambulatory Visit: Admitting: Internal Medicine

## 2023-12-24 ENCOUNTER — Encounter: Payer: Self-pay | Admitting: Internal Medicine

## 2023-12-24 VITALS — BP 124/82 | Ht 69.0 in | Wt 177.4 lb

## 2023-12-24 DIAGNOSIS — M7521 Bicipital tendinitis, right shoulder: Secondary | ICD-10-CM

## 2023-12-24 DIAGNOSIS — E663 Overweight: Secondary | ICD-10-CM

## 2023-12-24 DIAGNOSIS — R739 Hyperglycemia, unspecified: Secondary | ICD-10-CM

## 2023-12-24 DIAGNOSIS — E039 Hypothyroidism, unspecified: Secondary | ICD-10-CM | POA: Diagnosis not present

## 2023-12-24 DIAGNOSIS — E7800 Pure hypercholesterolemia, unspecified: Secondary | ICD-10-CM | POA: Diagnosis not present

## 2023-12-24 DIAGNOSIS — Z0001 Encounter for general adult medical examination with abnormal findings: Secondary | ICD-10-CM

## 2023-12-24 DIAGNOSIS — Z6826 Body mass index (BMI) 26.0-26.9, adult: Secondary | ICD-10-CM

## 2023-12-24 NOTE — Patient Instructions (Signed)
 Health Maintenance, Male  Adopting a healthy lifestyle and getting preventive care are important in promoting health and wellness. Ask your health care provider about:  The right schedule for you to have regular tests and exams.  Things you can do on your own to prevent diseases and keep yourself healthy.  What should I know about diet, weight, and exercise?  Eat a healthy diet    Eat a diet that includes plenty of vegetables, fruits, low-fat dairy products, and lean protein.  Do not eat a lot of foods that are high in solid fats, added sugars, or sodium.  Maintain a healthy weight  Body mass index (BMI) is a measurement that can be used to identify possible weight problems. It estimates body fat based on height and weight. Your health care provider can help determine your BMI and help you achieve or maintain a healthy weight.  Get regular exercise  Get regular exercise. This is one of the most important things you can do for your health. Most adults should:  Exercise for at least 150 minutes each week. The exercise should increase your heart rate and make you sweat (moderate-intensity exercise).  Do strengthening exercises at least twice a week. This is in addition to the moderate-intensity exercise.  Spend less time sitting. Even light physical activity can be beneficial.  Watch cholesterol and blood lipids  Have your blood tested for lipids and cholesterol at 68 years of age, then have this test every 5 years.  You may need to have your cholesterol levels checked more often if:  Your lipid or cholesterol levels are high.  You are older than 68 years of age.  You are at high risk for heart disease.  What should I know about cancer screening?  Many types of cancers can be detected early and may often be prevented. Depending on your health history and family history, you may need to have cancer screening at various ages. This may include screening for:  Colorectal cancer.  Prostate cancer.  Skin cancer.  Lung  cancer.  What should I know about heart disease, diabetes, and high blood pressure?  Blood pressure and heart disease  High blood pressure causes heart disease and increases the risk of stroke. This is more likely to develop in people who have high blood pressure readings or are overweight.  Talk with your health care provider about your target blood pressure readings.  Have your blood pressure checked:  Every 3-5 years if you are 24-52 years of age.  Every year if you are 3 years old or older.  If you are between the ages of 60 and 72 and are a current or former smoker, ask your health care provider if you should have a one-time screening for abdominal aortic aneurysm (AAA).  Diabetes  Have regular diabetes screenings. This checks your fasting blood sugar level. Have the screening done:  Once every three years after age 66 if you are at a normal weight and have a low risk for diabetes.  More often and at a younger age if you are overweight or have a high risk for diabetes.  What should I know about preventing infection?  Hepatitis B  If you have a higher risk for hepatitis B, you should be screened for this virus. Talk with your health care provider to find out if you are at risk for hepatitis B infection.  Hepatitis C  Blood testing is recommended for:  Everyone born from [birth year] through 1965.  Anyone  with known risk factors for hepatitis C.  Sexually transmitted infections (STIs)  You should be screened each year for STIs, including gonorrhea and chlamydia, if:  You are sexually active and are younger than 68 years of age.  You are older than 68 years of age and your health care provider tells you that you are at risk for this type of infection.  Your sexual activity has changed since you were last screened, and you are at increased risk for chlamydia or gonorrhea. Ask your health care provider if you are at risk.  Ask your health care provider about whether you are at high risk for HIV. Your health care provider  may recommend a prescription medicine to help prevent HIV infection. If you choose to take medicine to prevent HIV, you should first get tested for HIV. You should then be tested every 3 months for as long as you are taking the medicine.  Follow these instructions at home:  Alcohol use  Do not drink alcohol if your health care provider tells you not to drink.  If you drink alcohol:  Limit how much you have to 0-2 drinks a day.  Know how much alcohol is in your drink. In the [place] one drink equals one 12 oz bottle of beer (355 mL), one 5 oz glass of wine (148 mL), or one 1 oz glass of hard liquor (44 mL).  Lifestyle  Do not use any products that contain nicotine or tobacco. These products include cigarettes, chewing tobacco, and vaping devices, such as e-cigarettes. If you need help quitting, ask your health care provider.  Do not use street drugs.  Do not share needles.  Ask your health care provider for help if you need support or information about quitting drugs.  General instructions  Schedule regular health, dental, and eye exams.  Stay current with your vaccines.  Tell your health care provider if:  You often feel depressed.  You have ever been abused or do not feel safe at home.  Summary  Adopting a healthy lifestyle and getting preventive care are important in promoting health and wellness.  Follow your health care provider's instructions about healthy diet, exercising, and getting tested or screened for diseases.  Follow your health care provider's instructions on monitoring your cholesterol and blood pressure.  This information is not intended to replace advice given to you by your health care provider. Make sure you discuss any questions you have with your health care provider.  Document Revised: 05/28/2020 Document Reviewed: 05/28/2020  Elsevier Patient Education  2024 [employer].

## 2023-12-24 NOTE — Assessment & Plan Note (Signed)
 Encouraged diet and exercise for weight loss ?

## 2023-12-24 NOTE — Progress Notes (Signed)
 Subjective:    Patient ID: Bryce [name], male    DOB: 1955/04/13, 68 y.o.   MRN: [id number]  HPI  Patient presents to clinic today for his annual exam.  He also reports right shoulder pain.  He reports this has been going on for some time.  He reports he only has pain with certain movements or lifting heavy things.  He has not tried anything OTC for this.  Flu: never Tetanus: 03/2020 COVID: x 4 Pneumovax: never Prevnar 20: 11/2021 Shingrix: 06/2019, 08/2019 PSA screening: annually at [facility] Colon screening: 12/2021 Vision screening: annually Dentist: biannually  Diet: He does eat lean meat. He consumes fruits and veggies. He tries to avoid fried foods. He drinks mostly water, hot tea. Exercise: body weight exercise, treadmill.  Review of Systems     Past Medical History:  Diagnosis Date   Hyperlipidemia    Hypertension    Thyroid  disease     Current Outpatient Medications  Medication Sig Dispense Refill   cetirizine (ZYRTEC) 5 MG tablet Take 5 mg by mouth.     Cholecalciferol 25 MCG (1000 UT) tablet TAKE TWO TABLETS BY MOUTH ONCE EVERY DAY     fluticasone (FLONASE) 50 MCG/ACT nasal spray Place 2 sprays into both nostrils daily.     levothyroxine (SYNTHROID) 50 MCG tablet Take 50 mcg by mouth daily before breakfast.     lisinopril (ZESTRIL) 40 MG tablet Take 40 mg by mouth daily.     simvastatin  (ZOCOR ) 80 MG tablet Take 1 tablet (80 mg total) by mouth daily. 90 tablet 0   No current facility-administered medications for this visit.    No Known Allergies  Family History  Problem Relation Age of Onset   Breast cancer Mother    Cancer Father        Unknown Cancer   Healthy Sister     Social History   Socioeconomic History   Marital status: Single    Spouse name: Not on file   Number of children: Not on file   Years of education: Not on file   Highest education level: Not on file  Occupational History   Occupation: Retired  Tobacco Use   Smoking status:  Never   Smokeless tobacco: Never  Vaping Use   Vaping status: Never Used  Substance and Sexual Activity   Alcohol use: Never   Drug use: Never   Sexual activity: Not on file  Other Topics Concern   Not on file  Social History Narrative   Not on file   Social Drivers of Health   Financial Resource Strain: Low Risk  (01/23/2023)   Overall Financial Resource Strain (CARDIA)    Difficulty of Paying Living Expenses: Not hard at all  Food Insecurity: No Food Insecurity (07/14/2023)   Hunger Vital Sign    Worried About Running Out of Food in the Last Year: Never true    Ran Out of Food in the Last Year: Never true  Transportation Needs: No Transportation Needs (07/14/2023)   PRAPARE - Administrator, Civil Service (Medical): No    Lack of Transportation (Non-Medical): No  Physical Activity: Sufficiently Active (01/23/2023)   Exercise Vital Sign    Days of Exercise per Week: 4 days    Minutes of Exercise per Session: 50 min  Stress: No Stress Concern Present (01/23/2023)   [employer] of Occupational Health - Occupational Stress Questionnaire    Feeling of Stress : Not at all  Social Connections: Socially Isolated (  01/23/2023)   Social Connection and Isolation Panel    Frequency of Communication with Friends and Family: More than three times a week    Frequency of Social Gatherings with Friends and Family: More than three times a week    Attends Religious Services: Never    Database Administrator or Organizations: No    Attends Banker Meetings: Never    Marital Status: Divorced  Catering Manager Violence: Not At Risk (07/14/2023)   Humiliation, Afraid, Rape, and Kick questionnaire    Fear of Current or Ex-Partner: No    Emotionally Abused: No    Physically Abused: No    Sexually Abused: No     Constitutional: Denies fever, malaise, fatigue, headache or abrupt weight changes.  HEENT: Denies eye pain, eye redness, ear pain, ringing in the ears, wax  buildup, runny nose, nasal congestion, bloody nose, or sore throat. Respiratory: Denies difficulty breathing, shortness of breath, cough or sputum production.   Cardiovascular: Denies chest pain, chest tightness, palpitations or swelling in the hands or feet.  Gastrointestinal: Denies abdominal pain, bloating, constipation, diarrhea or blood in the stool.  GU: Denies urgency, frequency, pain with urination, burning sensation, blood in urine, odor or discharge. Musculoskeletal: Pt reports right shoulder pain. Denies decrease in range of motion, difficulty with gait, muscle pain or joint swelling.  Skin: Denies redness, rashes, lesions or ulcercations.  Neurological: Denies dizziness, difficulty with memory, difficulty with speech or problems with balance and coordination.  Psych: Denies anxiety, depression, SI/HI.  No other specific complaints in a complete review of systems (except as listed in HPI above).  Objective:   Physical Exam BP 124/82 (BP Location: Right Arm, Patient Position: Sitting, Cuff Size: Normal)   Ht 5' 9 (1.753 m)   Wt 177 lb 6.4 oz (80.5 kg)   BMI 26.20 kg/m     Wt Readings from Last 3 Encounters:  07/08/23 170 lb 12.8 oz (77.5 kg)  06/22/23 173 lb 3.2 oz (78.6 kg)  12/22/22 176 lb 6.4 oz (80 kg)    General: Appears his stated age, overweight, in NAD. Skin: Warm, dry and intact.  HEENT: Head: normal shape and size; Eyes: sclera white, no icterus, conjunctiva pink, PERRLA and EOMs intact;  Neck:  Neck supple, trachea midline. No masses, lumps or thyromegaly present.  Cardiovascular: Normal rate and rhythm. S1,S2 noted.  No murmur, rubs or gallops noted. No JVD or BLE edema. No carotid bruits noted.   Pulmonary/Chest: Normal effort and positive vesicular breath sounds. No respiratory distress. No wheezes, rales or ronchi noted.  Abdomen: Soft and nontender. Normal bowel sounds.  Musculoskeletal: Internal and external rotation of the right shoulder.  Pain with  palpation of the proximal anterior biceps tendon.  Strength 5/5 BUE/BLE. No difficulty with gait.  Neurological: Alert and oriented. Cranial nerves II-XII grossly intact. Coordination normal.  Psychiatric: Mood and affect normal. Behavior is normal. Judgment and thought content normal.          Assessment & Plan:   Preventative Health Maintenance:  He declines flu shots Tetanus UTD  Encouraged him to get his covid booster Prevnar 20 UTD, he will not need Pneumovax Shingrix UTD Colon screening UTD Encouraged him to consume a balanced diet and exercise regimen Advised him to see an eye doctor and dentist annually Will check CBC, c-Met, TSH, free T4 lipid, A1c today  Right shoulder pain:  Likely bicep tendinitis No indication for x-ray at this time Would avoid anti-inflammatories OTC due to  previous elevated serum creatinine and decreased GFR Can take Tylenol 1000 mg every 8 hours as needed Recommend ice for 10 minutes twice daily Could consider physical therapy if symptoms persist or worsen.  RTC in 6 months, follow-up chronic conditions Angeline Laura, NP

## 2023-12-25 ENCOUNTER — Ambulatory Visit: Payer: Self-pay | Admitting: Internal Medicine

## 2023-12-25 LAB — CBC
HCT: 44.6 % (ref 39.4–51.1)
Hemoglobin: 14.9 g/dL (ref 13.2–17.1)
MCH: 28.5 pg (ref 27.0–33.0)
MCHC: 33.4 g/dL (ref 31.6–35.4)
MCV: 85.4 fL (ref 81.4–101.7)
MPV: 10.9 fL (ref 7.5–12.5)
Platelets: 171 Thousand/uL (ref 140–400)
RBC: 5.22 Million/uL (ref 4.20–5.80)
RDW: 12.9 % (ref 11.0–15.0)
WBC: 3.1 Thousand/uL — ABNORMAL LOW (ref 3.8–10.8)

## 2023-12-25 LAB — COMPREHENSIVE METABOLIC PANEL WITH GFR
AG Ratio: 1.7 (calc) (ref 1.0–2.5)
ALT: 30 U/L (ref 9–46)
AST: 29 U/L (ref 10–35)
Albumin: 4.3 g/dL (ref 3.6–5.1)
Alkaline phosphatase (APISO): 43 U/L (ref 35–144)
BUN/Creatinine Ratio: 15 (calc) (ref 6–22)
BUN: 20 mg/dL (ref 7–25)
CO2: 26 mmol/L (ref 20–32)
Calcium: 10.2 mg/dL (ref 8.6–10.3)
Chloride: 105 mmol/L (ref 98–110)
Creat: 1.36 mg/dL — ABNORMAL HIGH (ref 0.70–1.35)
Globulin: 2.5 g/dL (ref 1.9–3.7)
Glucose, Bld: 87 mg/dL (ref 65–99)
Potassium: 4.4 mmol/L (ref 3.5–5.3)
Sodium: 140 mmol/L (ref 135–146)
Total Bilirubin: 0.7 mg/dL (ref 0.2–1.2)
Total Protein: 6.8 g/dL (ref 6.1–8.1)
eGFR: 57 mL/min/1.73m2 — ABNORMAL LOW (ref >=60)

## 2023-12-25 LAB — LIPID PANEL
Cholesterol: 174 mg/dL (ref <200)
HDL: 58 mg/dL (ref >=40)
LDL Cholesterol (Calc): 102 mg/dL — ABNORMAL HIGH
Non-HDL Cholesterol (Calc): 116 mg/dL (ref <130)
Total CHOL/HDL Ratio: 3 (calc) (ref <5.0)
Triglycerides: 46 mg/dL (ref <150)

## 2023-12-25 LAB — T4, FREE: Free T4: 1.5 ng/dL (ref 0.8–1.8)

## 2023-12-25 LAB — HEMOGLOBIN A1C
Hgb A1c MFr Bld: 5.7 % — ABNORMAL HIGH (ref <5.7)
Mean Plasma Glucose: 117 mg/dL
eAG (mmol/L): 6.5 mmol/L

## 2023-12-25 LAB — TSH: TSH: 2.98 m[IU]/L (ref 0.40–4.50)

## 2024-01-27 ENCOUNTER — Ambulatory Visit

## 2024-06-20 ENCOUNTER — Ambulatory Visit: Admitting: Internal Medicine
